# Patient Record
Sex: Male | Born: 1990 | Race: Black or African American | Hispanic: No | Marital: Single | Smoking: Never smoker
Health system: Southern US, Community
[De-identification: ages and names within clinical notes are randomized; demographics above are authoritative.]

## PROBLEM LIST (undated history)

## (undated) DIAGNOSIS — R7303 Prediabetes: Secondary | ICD-10-CM

## (undated) DIAGNOSIS — I2510 Atherosclerotic heart disease of native coronary artery without angina pectoris: Secondary | ICD-10-CM

## (undated) DIAGNOSIS — F1290 Cannabis use, unspecified, uncomplicated: Secondary | ICD-10-CM

## (undated) DIAGNOSIS — R931 Abnormal findings on diagnostic imaging of heart and coronary circulation: Secondary | ICD-10-CM

## (undated) DIAGNOSIS — W3400XA Accidental discharge from unspecified firearms or gun, initial encounter: Secondary | ICD-10-CM

## (undated) DIAGNOSIS — I255 Ischemic cardiomyopathy: Secondary | ICD-10-CM

## (undated) HISTORY — PX: ARTHROSCOPIC REPAIR ACL: SUR80

---
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
## 2011-07-26 LAB — RAPID URINE DRUG SCREEN, HOSP PERFORMED
Amphetamines: NOT DETECTED
Barbiturates: NOT DETECTED
Benzodiazepines: NOT DETECTED
Cocaine: NOT DETECTED
Opiates: NOT DETECTED
Tetrahydrocannabinol: POSITIVE — AB

## 2011-07-26 LAB — ETHANOL: Alcohol, Ethyl (B): <5

## 2011-07-26 LAB — SALICYLATE LEVEL: Salicylate Lvl: <4

## 2011-07-26 LAB — ACETAMINOPHEN LEVEL: Acetaminophen (Tylenol), Serum: <10 — ABNORMAL LOW

## 2012-11-04 ENCOUNTER — Emergency Department (HOSPITAL_COMMUNITY): Payer: Self-pay

## 2012-11-04 ENCOUNTER — Emergency Department (HOSPITAL_COMMUNITY)
Admission: EM | Admit: 2012-11-04 | Discharge: 2012-11-04 | Disposition: A | Discharge status: Home / Self Care | Payer: Self-pay | Attending: Emergency Medicine | Admitting: Emergency Medicine

## 2012-11-04 DIAGNOSIS — Y9301 Activity, walking, marching and hiking: Secondary | ICD-10-CM | POA: Insufficient documentation

## 2012-11-04 DIAGNOSIS — Z23 Encounter for immunization: Secondary | ICD-10-CM | POA: Insufficient documentation

## 2012-11-04 DIAGNOSIS — S61209A Unspecified open wound of unspecified finger without damage to nail, initial encounter: Secondary | ICD-10-CM | POA: Insufficient documentation

## 2012-11-04 DIAGNOSIS — S61239A Puncture wound without foreign body of unspecified finger without damage to nail, initial encounter: Secondary | ICD-10-CM

## 2012-11-04 DIAGNOSIS — W320XXA Accidental handgun discharge, initial encounter: Secondary | ICD-10-CM | POA: Insufficient documentation

## 2012-11-04 DIAGNOSIS — Y929 Unspecified place or not applicable: Secondary | ICD-10-CM | POA: Insufficient documentation

## 2012-11-04 MED ORDER — TETANUS-DIPHTH-ACELL PERTUSSIS 5-2.5-18.5 LF-MCG/0.5 IM SUSP
0.5000 mL | Freq: Once | INTRAMUSCULAR | Status: AC
  Administered 2012-11-04: 0.5 mL via INTRAMUSCULAR
  Filled 2012-11-04: qty 0.5

## 2012-11-04 MED ORDER — IBUPROFEN 600 MG PO TABS: 600.0000 mg | ORAL_TABLET | Freq: Four times a day (QID) | ORAL | Status: DC | PRN

## 2012-11-04 MED ORDER — AMOXICILLIN-POT CLAVULANATE 875-125 MG PO TABS: 1.0000 | ORAL_TABLET | Freq: Two times a day (BID) | ORAL | Status: DC

## 2012-11-04 MED ORDER — HYDROCODONE-ACETAMINOPHEN 5-325 MG PO TABS: 1.0000 | ORAL_TABLET | Freq: Four times a day (QID) | ORAL | Status: DC | PRN

## 2012-11-04 MED ORDER — AMOXICILLIN-POT CLAVULANATE 875-125 MG PO TABS
1.0000 | ORAL_TABLET | Freq: Once | ORAL | Status: AC
  Administered 2012-11-04: 1 via ORAL
  Filled 2012-11-04: qty 1
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
## 2012-11-19 ENCOUNTER — Emergency Department (HOSPITAL_COMMUNITY)
Admission: EM | Admit: 2012-11-19 | Discharge: 2012-11-19 | Disposition: A | Discharge status: Home / Self Care | Payer: Self-pay | Attending: Emergency Medicine | Admitting: Emergency Medicine

## 2012-11-19 ENCOUNTER — Encounter (HOSPITAL_COMMUNITY): Payer: Self-pay | Admitting: Cardiology

## 2012-11-19 ENCOUNTER — Emergency Department (HOSPITAL_COMMUNITY): Payer: Self-pay

## 2012-11-19 DIAGNOSIS — Z87828 Personal history of other (healed) physical injury and trauma: Secondary | ICD-10-CM | POA: Insufficient documentation

## 2012-11-19 DIAGNOSIS — Y929 Unspecified place or not applicable: Secondary | ICD-10-CM | POA: Insufficient documentation

## 2012-11-19 DIAGNOSIS — X58XXXA Exposure to other specified factors, initial encounter: Secondary | ICD-10-CM | POA: Insufficient documentation

## 2012-11-19 DIAGNOSIS — F17200 Nicotine dependence, unspecified, uncomplicated: Secondary | ICD-10-CM | POA: Insufficient documentation

## 2012-11-19 DIAGNOSIS — Y9302 Activity, running: Secondary | ICD-10-CM | POA: Insufficient documentation

## 2012-11-19 DIAGNOSIS — M25562 Pain in left knee: Secondary | ICD-10-CM

## 2012-11-19 DIAGNOSIS — S99919A Unspecified injury of unspecified ankle, initial encounter: Secondary | ICD-10-CM | POA: Insufficient documentation

## 2012-11-19 DIAGNOSIS — S8990XA Unspecified injury of unspecified lower leg, initial encounter: Secondary | ICD-10-CM | POA: Insufficient documentation

## 2012-11-19 DIAGNOSIS — Z4800 Encounter for change or removal of nonsurgical wound dressing: Secondary | ICD-10-CM | POA: Insufficient documentation

## 2012-11-19 MED ORDER — NAPROXEN 500 MG PO TABS: 500.0000 mg | ORAL_TABLET | Freq: Two times a day (BID) | ORAL | Status: DC

## 2012-11-19 MED ORDER — HYDROCODONE-ACETAMINOPHEN 5-325 MG PO TABS: 1.0000 | ORAL_TABLET | Freq: Four times a day (QID) | ORAL | Status: DC | PRN

## 2012-11-19 NOTE — ED Notes (Signed)
Discharge instructions reviewed. Pt verbalized understanding.  

## 2012-11-19 NOTE — Progress Notes (Signed)
Orthopedic Tech Progress Note Patient Details:  Paul Chandler November 19, 1990 [id number] Knee Immobilizer applied to Left LE with proper use and instructions given. Application tolerated well. Patient to wear immobilizer when walking.  Ortho Devices Type of Ortho Device: Knee Immobilizer Ortho Device/Splint Location: Left  Ortho Device/Splint Interventions: Application   Asia R Thompson 11/19/2012, 4:23 PM

## 2012-11-19 NOTE — ED Notes (Signed)
Onalee Hua NP at beside.

## 2012-11-19 NOTE — ED Notes (Signed)
Pt back from x-ray.

## 2012-11-19 NOTE — ED Provider Notes (Signed)
Medical screening examination/treatment/procedure(s) were performed by non-physician practitioner and as supervising physician I was immediately available for consultation/collaboration.   Preesha Benjamin J. Braxton Vantrease, MD 11/19/12 2300 

## 2012-11-19 NOTE — ED Notes (Signed)
Patient transported to X-ray 

## 2012-11-19 NOTE — ED Notes (Signed)
Pt states he injured his left knee yesterday playing around. Rates pain 7/10. Pt also states he has some staples in left hand that need to be removed.
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
## 2015-07-01 ENCOUNTER — Emergency Department (HOSPITAL_COMMUNITY): Payer: Self-pay

## 2015-07-01 ENCOUNTER — Encounter (HOSPITAL_COMMUNITY): Payer: Self-pay | Admitting: *Deleted

## 2015-07-01 ENCOUNTER — Encounter (HOSPITAL_COMMUNITY): Payer: Self-pay | Admitting: Cardiology

## 2015-07-01 ENCOUNTER — Emergency Department (HOSPITAL_COMMUNITY)
Admission: EM | Admit: 2015-07-01 | Discharge: 2015-07-01 | Disposition: A | Discharge status: Home / Self Care | Payer: Self-pay | Attending: Emergency Medicine | Admitting: Emergency Medicine

## 2015-07-01 DIAGNOSIS — W3400XA Accidental discharge from unspecified firearms or gun, initial encounter: Secondary | ICD-10-CM

## 2015-07-01 DIAGNOSIS — S21232A Puncture wound without foreign body of left back wall of thorax without penetration into thoracic cavity, initial encounter: Secondary | ICD-10-CM

## 2015-07-01 DIAGNOSIS — S21402A Unspecified open wound of left back wall of thorax with penetration into thoracic cavity, initial encounter: Secondary | ICD-10-CM | POA: Insufficient documentation

## 2015-07-01 DIAGNOSIS — Y939 Activity, unspecified: Secondary | ICD-10-CM | POA: Insufficient documentation

## 2015-07-01 DIAGNOSIS — Y999 Unspecified external cause status: Secondary | ICD-10-CM | POA: Insufficient documentation

## 2015-07-01 DIAGNOSIS — R000 Tachycardia, unspecified: Secondary | ICD-10-CM | POA: Insufficient documentation

## 2015-07-01 DIAGNOSIS — Y929 Unspecified place or not applicable: Secondary | ICD-10-CM | POA: Insufficient documentation

## 2015-07-01 LAB — PREPARE FRESH FROZEN PLASMA
Unit division: 0
Unit division: 0

## 2015-07-01 LAB — TYPE AND SCREEN
ABO/RH(D): A POS
ANTIBODY SCREEN: NEGATIVE
UNIT DIVISION: 0
Unit division: 0

## 2015-07-01 LAB — CDS SEROLOGY

## 2015-07-01 LAB — COMPREHENSIVE METABOLIC PANEL
ALK PHOS: 61 U/L (ref 38–126)
ALT: 21 U/L (ref 17–63)
ANION GAP: 14 (ref 5–15)
AST: 23 U/L (ref 15–41)
Albumin: 4.6 g/dL (ref 3.5–5.0)
BILIRUBIN TOTAL: 0.6 mg/dL (ref 0.3–1.2)
BUN: 14 mg/dL (ref 6–20)
CALCIUM: 9.3 mg/dL (ref 8.9–10.3)
CO2: 22 mmol/L (ref 22–32)
CREATININE: 1.52 mg/dL — AB (ref 0.61–1.24)
Chloride: 101 mmol/L (ref 101–111)
GFR calc non Af Amer: >60 mL/min (ref >60)
Glucose, Bld: 142 mg/dL — ABNORMAL HIGH (ref 65–99)
Potassium: 3.4 mmol/L — ABNORMAL LOW (ref 3.5–5.1)
SODIUM: 137 mmol/L (ref 135–145)
TOTAL PROTEIN: 7.4 g/dL (ref 6.5–8.1)

## 2015-07-01 LAB — ABO/RH: ABO/RH(D): A POS

## 2015-07-01 LAB — CBC
HCT: 46.4 % (ref 39.0–52.0)
Hemoglobin: 15.2 g/dL (ref 13.0–17.0)
MCH: 29.2 pg (ref 26.0–34.0)
MCHC: 32.8 g/dL (ref 30.0–36.0)
MCV: 89.1 fL (ref 78.0–100.0)
PLATELETS: 295 10*3/uL (ref 150–400)
RBC: 5.21 MIL/uL (ref 4.22–5.81)
RDW: 12.8 % (ref 11.5–15.5)
WBC: 6.8 10*3/uL (ref 4.0–10.5)

## 2015-07-01 LAB — ETHANOL

## 2015-07-01 LAB — PROTIME-INR
INR: 1.01 (ref 0.00–1.49)
PROTHROMBIN TIME: 13.5 s (ref 11.6–15.2)

## 2015-07-01 MED ORDER — MORPHINE SULFATE (PF) 4 MG/ML IV SOLN: 4.0000 mg | INTRAVENOUS | Status: DC | PRN

## 2015-07-01 MED ORDER — IOHEXOL 300 MG/ML  SOLN
75.0000 mL | Freq: Once | INTRAMUSCULAR | Status: AC | PRN
  Administered 2015-07-01: 75 mL via INTRAVENOUS

## 2015-07-01 MED ORDER — TRAMADOL HCL 50 MG PO TABS: 50.0000 mg | ORAL_TABLET | Freq: Four times a day (QID) | ORAL | Status: DC | PRN

## 2015-07-01 NOTE — ED Notes (Signed)
Detective at bedside
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
## 2015-07-01 NOTE — ED Notes (Signed)
Pt transported to CT on monitor with nurse at bedside.
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
## 2015-07-05 ENCOUNTER — Emergency Department (HOSPITAL_COMMUNITY): Payer: Self-pay

## 2015-07-05 ENCOUNTER — Encounter (HOSPITAL_COMMUNITY): Payer: Self-pay | Admitting: Emergency Medicine

## 2015-07-05 ENCOUNTER — Emergency Department (HOSPITAL_COMMUNITY)
Admission: EM | Admit: 2015-07-05 | Discharge: 2015-07-05 | Disposition: A | Discharge status: Home / Self Care | Payer: Self-pay | Attending: Emergency Medicine | Admitting: Emergency Medicine

## 2015-07-05 DIAGNOSIS — S21232D Puncture wound without foreign body of left back wall of thorax without penetration into thoracic cavity, subsequent encounter: Secondary | ICD-10-CM

## 2015-07-05 DIAGNOSIS — Z791 Long term (current) use of non-steroidal anti-inflammatories (NSAID): Secondary | ICD-10-CM | POA: Insufficient documentation

## 2015-07-05 DIAGNOSIS — S31000D Unspecified open wound of lower back and pelvis without penetration into retroperitoneum, subsequent encounter: Secondary | ICD-10-CM | POA: Insufficient documentation

## 2015-07-05 DIAGNOSIS — W3400XD Accidental discharge from unspecified firearms or gun, subsequent encounter: Secondary | ICD-10-CM | POA: Insufficient documentation

## 2015-07-05 HISTORY — DX: Accidental discharge from unspecified firearms or gun, initial encounter: W34.00XA

## 2015-07-05 MED ORDER — HYDROCODONE-ACETAMINOPHEN 5-325 MG PO TABS: 2.0000 | ORAL_TABLET | ORAL | Status: DC | PRN

## 2015-07-05 MED ORDER — SULFAMETHOXAZOLE-TRIMETHOPRIM 800-160 MG PO TABS: 1.0000 | ORAL_TABLET | Freq: Two times a day (BID) | ORAL | Status: AC
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
## 2015-07-16 ENCOUNTER — Telehealth (HOSPITAL_COMMUNITY): Payer: Self-pay | Admitting: Emergency Medicine
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
## 2015-10-11 ENCOUNTER — Encounter (HOSPITAL_COMMUNITY): Payer: Self-pay | Admitting: Family Medicine

## 2015-10-11 ENCOUNTER — Emergency Department (HOSPITAL_COMMUNITY)
Admission: EM | Admit: 2015-10-11 | Discharge: 2015-10-11 | Disposition: A | Discharge status: Home / Self Care | Payer: Self-pay | Attending: Emergency Medicine | Admitting: Emergency Medicine

## 2015-10-11 ENCOUNTER — Emergency Department (HOSPITAL_COMMUNITY): Payer: Self-pay

## 2015-10-11 DIAGNOSIS — F17200 Nicotine dependence, unspecified, uncomplicated: Secondary | ICD-10-CM | POA: Insufficient documentation

## 2015-10-11 DIAGNOSIS — N23 Unspecified renal colic: Secondary | ICD-10-CM | POA: Insufficient documentation

## 2015-10-11 DIAGNOSIS — N200 Calculus of kidney: Secondary | ICD-10-CM | POA: Insufficient documentation

## 2015-10-11 LAB — URINALYSIS, ROUTINE W REFLEX MICROSCOPIC
BILIRUBIN URINE: NEGATIVE
Glucose, UA: 100 mg/dL — AB
Ketones, ur: 15 mg/dL — AB
NITRITE: NEGATIVE
PROTEIN: 100 mg/dL — AB
SPECIFIC GRAVITY, URINE: 1.026 (ref 1.005–1.030)
pH: 8 (ref 5.0–8.0)

## 2015-10-11 LAB — COMPREHENSIVE METABOLIC PANEL
ALBUMIN: 4.6 g/dL (ref 3.5–5.0)
ALK PHOS: 54 U/L (ref 38–126)
ALT: 23 U/L (ref 17–63)
ANION GAP: 9 (ref 5–15)
AST: 23 U/L (ref 15–41)
BILIRUBIN TOTAL: 0.3 mg/dL (ref 0.3–1.2)
BUN: 12 mg/dL (ref 6–20)
CALCIUM: 9.8 mg/dL (ref 8.9–10.3)
CO2: 25 mmol/L (ref 22–32)
Chloride: 105 mmol/L (ref 101–111)
Creatinine, Ser: 1.21 mg/dL (ref 0.61–1.24)
Glucose, Bld: 134 mg/dL — ABNORMAL HIGH (ref 65–99)
Potassium: 4.2 mmol/L (ref 3.5–5.1)
Sodium: 139 mmol/L (ref 135–145)
TOTAL PROTEIN: 7.3 g/dL (ref 6.5–8.1)

## 2015-10-11 LAB — CBC
HCT: 45.8 % (ref 39.0–52.0)
HEMOGLOBIN: 15 g/dL (ref 13.0–17.0)
MCH: 28.6 pg (ref 26.0–34.0)
MCHC: 32.8 g/dL (ref 30.0–36.0)
MCV: 87.2 fL (ref 78.0–100.0)
PLATELETS: 288 10*3/uL (ref 150–400)
RBC: 5.25 MIL/uL (ref 4.22–5.81)
RDW: 12.8 % (ref 11.5–15.5)
WBC: 9.1 10*3/uL (ref 4.0–10.5)

## 2015-10-11 LAB — URINE MICROSCOPIC-ADD ON: SQUAMOUS EPITHELIAL / LPF: NONE SEEN

## 2015-10-11 LAB — LIPASE, BLOOD: Lipase: 19 U/L (ref 11–51)

## 2015-10-11 MED ORDER — LORAZEPAM 2 MG/ML IJ SOLN
1.0000 mg | Freq: Once | INTRAMUSCULAR | Status: AC
  Administered 2015-10-11: 1 mg via INTRAVENOUS
  Filled 2015-10-11: qty 1

## 2015-10-11 MED ORDER — KETOROLAC TROMETHAMINE 30 MG/ML IJ SOLN
30.0000 mg | Freq: Once | INTRAMUSCULAR | Status: AC
  Administered 2015-10-11: 30 mg via INTRAVENOUS
  Filled 2015-10-11: qty 1

## 2015-10-11 MED ORDER — OXYCODONE-ACETAMINOPHEN 5-325 MG PO TABS: 1.0000 | ORAL_TABLET | Freq: Four times a day (QID) | ORAL | Status: DC | PRN

## 2015-10-11 MED ORDER — HYDROMORPHONE HCL 1 MG/ML IJ SOLN
1.0000 mg | Freq: Once | INTRAMUSCULAR | Status: AC
  Administered 2015-10-11: 1 mg via INTRAVENOUS
  Filled 2015-10-11: qty 1

## 2015-10-11 MED ORDER — ONDANSETRON HCL 4 MG/2ML IJ SOLN
4.0000 mg | Freq: Once | INTRAMUSCULAR | Status: AC
  Administered 2015-10-11: 4 mg via INTRAVENOUS
  Filled 2015-10-11: qty 2

## 2015-10-11 NOTE — ED Notes (Signed)
Pt here for lower back pain and abd pain that started all of a sudden today. sts burning, N,V

## 2015-10-11 NOTE — ED Notes (Signed)
Pt is aware urine is needed, pt unable to urinate at this time. Urinal at bedside.  
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
## 2015-10-11 NOTE — Discharge Instructions (Signed)
It was our pleasure to provide your ER care today - we hope that you feel better.  Drink plenty of fluids. Strain urine.   Take motrin or aleve as need for pain. You may also take percocet as need for pain - no driving for the next 6 hours or when taking percocet. Also, do not take tylenol or acetaminophen containing medications when taking percocet.   Follow up with urologist in next few days if symptoms fail to resolve - see referral, call office to arrange appointment.   Return to ER if worse, intractable pain, fevers, persistent vomiting, other concern.   You were given pain medication in the ER - no driving for the next 6 hours.    Kidney Stones Kidney stones (urolithiasis) are deposits that form inside your kidneys. The intense pain is caused by the stone moving through the urinary tract. When the stone moves, the ureter goes into spasm around the stone. The stone is usually passed in the urine.  CAUSES   A disorder that makes certain neck glands produce too much parathyroid hormone (primary hyperparathyroidism).  A buildup of uric acid crystals, similar to gout in your joints.  Narrowing (stricture) of the ureter.  A kidney obstruction present at birth (congenital obstruction).  Previous surgery on the kidney or ureters.  Numerous kidney infections. SYMPTOMS   Feeling sick to your stomach (nauseous).  Throwing up (vomiting).  Blood in the urine (hematuria).  Pain that usually spreads (radiates) to the groin.  Frequency or urgency of urination. DIAGNOSIS   Taking a history and physical exam.  Blood or urine tests.  CT scan.  Occasionally, an examination of the inside of the urinary bladder (cystoscopy) is performed. TREATMENT   Observation.  Increasing your fluid intake.  Extracorporeal shock wave lithotripsy--This is a noninvasive procedure that uses shock waves to break up kidney stones.  Surgery may be needed if you have severe pain or persistent  obstruction. There are various surgical procedures. Most of the procedures are performed with the use of small instruments. Only small incisions are needed to accommodate these instruments, so recovery time is minimized. The size, location, and chemical composition are all important variables that will determine the proper choice of action for you. Talk to your health care provider to better understand your situation so that you will minimize the risk of injury to yourself and your kidney.  HOME CARE INSTRUCTIONS   Drink enough water and fluids to keep your urine clear or pale yellow. This will help you to pass the stone or stone fragments.  Strain all urine through the provided strainer. Keep all particulate matter and stones for your health care provider to see. The stone causing the pain may be as small as a grain of salt. It is very important to use the strainer each and every time you pass your urine. The collection of your stone will allow your health care provider to analyze it and verify that a stone has actually passed. The stone analysis will often identify what you can do to reduce the incidence of recurrences.  Only take over-the-counter or prescription medicines for pain, discomfort, or fever as directed by your health care provider.  Keep all follow-up visits as told by your health care provider. This is important.  Get follow-up X-rays if required. The absence of pain does not always mean that the stone has passed. It may have only stopped moving. If the urine remains completely obstructed, it can cause loss of kidney  function or even complete destruction of the kidney. It is your responsibility to make sure X-rays and follow-ups are completed. Ultrasounds of the kidney can show blockages and the status of the kidney. Ultrasounds are not associated with any radiation and can be performed easily in a matter of minutes.  Make changes to your daily diet as told by your health care provider.  You may be told to:  Limit the amount of salt that you eat.  Eat 5 or more servings of fruits and vegetables each day.  Limit the amount of meat, poultry, fish, and eggs that you eat.  Collect a 24-hour urine sample as told by your health care provider.You may need to collect another urine sample every 6-12 months. SEEK MEDICAL CARE IF:  You experience pain that is progressive and unresponsive to any pain medicine you have been prescribed. SEEK IMMEDIATE MEDICAL CARE IF:   Pain cannot be controlled with the prescribed medicine.  You have a fever or shaking chills.  The severity or intensity of pain increases over 18 hours and is not relieved by pain medicine.  You develop a new onset of abdominal pain.  You feel faint or pass out.  You are unable to urinate.   This information is not intended to replace advice given to you by your health care provider. Make sure you discuss any questions you have with your health care provider.   Document Released: 10/09/2005 Document Revised: 06/30/2015 Document Reviewed: 03/12/2013 Elsevier Interactive Patient Education [employer]2016 [employer]

## 2015-10-13 ENCOUNTER — Encounter (HOSPITAL_COMMUNITY): Payer: Self-pay | Admitting: *Deleted

## 2015-10-13 ENCOUNTER — Emergency Department (HOSPITAL_COMMUNITY)
Admission: EM | Admit: 2015-10-13 | Discharge: 2015-10-13 | Disposition: A | Discharge status: Home / Self Care | Payer: Self-pay | Attending: Emergency Medicine | Admitting: Emergency Medicine

## 2015-10-13 DIAGNOSIS — Z87828 Personal history of other (healed) physical injury and trauma: Secondary | ICD-10-CM | POA: Insufficient documentation

## 2015-10-13 DIAGNOSIS — N200 Calculus of kidney: Secondary | ICD-10-CM | POA: Insufficient documentation

## 2015-10-13 DIAGNOSIS — R55 Syncope and collapse: Secondary | ICD-10-CM | POA: Insufficient documentation

## 2015-10-13 DIAGNOSIS — R42 Dizziness and giddiness: Secondary | ICD-10-CM | POA: Insufficient documentation

## 2015-10-13 DIAGNOSIS — R630 Anorexia: Secondary | ICD-10-CM | POA: Insufficient documentation

## 2015-10-13 DIAGNOSIS — F17200 Nicotine dependence, unspecified, uncomplicated: Secondary | ICD-10-CM | POA: Insufficient documentation

## 2015-10-13 LAB — CBC
HEMATOCRIT: 46.4 % (ref 39.0–52.0)
HEMOGLOBIN: 15.2 g/dL (ref 13.0–17.0)
MCH: 28.7 pg (ref 26.0–34.0)
MCHC: 32.8 g/dL (ref 30.0–36.0)
MCV: 87.5 fL (ref 78.0–100.0)
Platelets: 289 10*3/uL (ref 150–400)
RBC: 5.3 MIL/uL (ref 4.22–5.81)
RDW: 13 % (ref 11.5–15.5)
WBC: 9.2 10*3/uL (ref 4.0–10.5)

## 2015-10-13 LAB — COMPREHENSIVE METABOLIC PANEL
ALBUMIN: 4.5 g/dL (ref 3.5–5.0)
ALT: 33 U/L (ref 17–63)
ANION GAP: 10 (ref 5–15)
AST: 32 U/L (ref 15–41)
Alkaline Phosphatase: 60 U/L (ref 38–126)
BILIRUBIN TOTAL: 0.9 mg/dL (ref 0.3–1.2)
BUN: 12 mg/dL (ref 6–20)
CHLORIDE: 104 mmol/L (ref 101–111)
CO2: 26 mmol/L (ref 22–32)
Calcium: 10.1 mg/dL (ref 8.9–10.3)
Creatinine, Ser: 1.66 mg/dL — ABNORMAL HIGH (ref 0.61–1.24)
GFR, EST NON AFRICAN AMERICAN: 57 mL/min — AB (ref >60)
Glucose, Bld: 109 mg/dL — ABNORMAL HIGH (ref 65–99)
POTASSIUM: 4.2 mmol/L (ref 3.5–5.1)
SODIUM: 140 mmol/L (ref 135–145)
TOTAL PROTEIN: 7.6 g/dL (ref 6.5–8.1)

## 2015-10-13 LAB — URINE MICROSCOPIC-ADD ON

## 2015-10-13 LAB — URINALYSIS, ROUTINE W REFLEX MICROSCOPIC
Bilirubin Urine: NEGATIVE
GLUCOSE, UA: NEGATIVE mg/dL
KETONES UR: NEGATIVE mg/dL
LEUKOCYTES UA: NEGATIVE
NITRITE: NEGATIVE
PH: 8 (ref 5.0–8.0)
Protein, ur: 30 mg/dL — AB
Specific Gravity, Urine: 1.025 (ref 1.005–1.030)

## 2015-10-13 LAB — LIPASE, BLOOD: LIPASE: 20 U/L (ref 11–51)

## 2015-10-13 MED ORDER — ONDANSETRON 4 MG PO TBDP
ORAL_TABLET | ORAL | Status: AC
  Filled 2015-10-13: qty 1

## 2015-10-13 MED ORDER — SODIUM CHLORIDE 0.9 % IV SOLN
Freq: Once | INTRAVENOUS | Status: AC
  Administered 2015-10-13: 16:00:00 via INTRAVENOUS

## 2015-10-13 MED ORDER — KETOROLAC TROMETHAMINE 30 MG/ML IJ SOLN
30.0000 mg | Freq: Once | INTRAMUSCULAR | Status: AC
  Administered 2015-10-13: 30 mg via INTRAVENOUS
  Filled 2015-10-13: qty 1

## 2015-10-13 MED ORDER — ONDANSETRON 4 MG PO TBDP
4.0000 mg | ORAL_TABLET | Freq: Once | ORAL | Status: AC | PRN
  Administered 2015-10-13: 4 mg via ORAL

## 2015-10-13 MED ORDER — ONDANSETRON HCL 4 MG/2ML IJ SOLN
4.0000 mg | Freq: Once | INTRAMUSCULAR | Status: AC
  Administered 2015-10-13: 4 mg via INTRAVENOUS
  Filled 2015-10-13: qty 2

## 2015-10-13 MED ORDER — ONDANSETRON HCL 4 MG PO TABS: 4.0000 mg | ORAL_TABLET | Freq: Three times a day (TID) | ORAL | Status: DC | PRN
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
## 2015-10-13 NOTE — ED Notes (Signed)
Pt reports being seen here on Monday and diagnosed with kidney stone, now having n/v and reports vomiting 15 times today. Reports feeling weak and near syncope. No acute distress noted at triage.

## 2015-10-13 NOTE — ED Notes (Signed)
Pt is in stable condition upon d/c and ambulates from ED. 

## 2015-10-13 NOTE — Discharge Instructions (Signed)
[name], you were seen in the emergency room for a kidney stone. For your pain, you can finish out your hydrocodone and you can also take 4 tablets of 200 mg ibuprofen three times a day with meals. We are also prescribing zofran tablets that you can take at home.    Dietary Guidelines to Help Prevent Kidney Stones Your risk of kidney stones can be decreased by adjusting the foods you eat. The most important thing you can do is drink enough fluid. You should drink enough fluid to keep your urine clear or pale yellow. The following guidelines provide specific information for the type of kidney stone you have had. GUIDELINES ACCORDING TO TYPE OF KIDNEY STONE Calcium Oxalate Kidney Stones  Reduce the amount of salt you eat. Foods that have a lot of salt cause your body to release excess calcium into your urine. The excess calcium can combine with a substance called oxalate to form kidney stones.  Reduce the amount of animal protein you eat if the amount you eat is excessive. Animal protein causes your body to release excess calcium into your urine. Ask your dietitian how much protein from animal sources you should be eating.  Avoid foods that are high in oxalates. If you take vitamins, they should have less than 500 mg of vitamin C. Your body turns vitamin C into oxalates. You do not need to avoid fruits and vegetables high in vitamin C. Calcium Phosphate Kidney Stones  Reduce the amount of salt you eat to help prevent the release of excess calcium into your urine.  Reduce the amount of animal protein you eat if the amount you eat is excessive. Animal protein causes your body to release excess calcium into your urine. Ask your dietitian how much protein from animal sources you should be eating.  Get enough calcium from food or take a calcium supplement (ask your dietitian for recommendations). Food sources of calcium that do not increase your risk of kidney stones include:  Broccoli.  Dairy  products, such as cheese and yogurt.  Pudding. Uric Acid Kidney Stones  Do not have more than 6 oz of animal protein per day. FOOD SOURCES Animal Protein Sources  Meat (all types).  Poultry.  Eggs.  Fish, seafood. Foods High in MirantSalt  Salt seasonings.  Soy sauce.  Teriyaki sauce.  Cured and processed meats.  Salted crackers and snack foods.  Fast food.  Canned soups and most canned foods. Foods High in Oxalates  Grains:  Amaranth.  Barley.  Grits.  Wheat germ.  Bran.  Buckwheat flour.  All bran cereals.  Pretzels.  Whole wheat bread.  Vegetables:  Beans (wax).  Beets and beet greens.  Collard greens.  Eggplant.  Escarole.  Leeks.  Okra.  Parsley.  Rutabagas.  Spinach.  Swiss chard.  Tomato paste.  Fried potatoes.  Sweet potatoes.  Fruits:  Red currants.  Figs.  Kiwi.  Rhubarb.  Meat and Other Protein Sources:  Beans (dried).  Soy burgers and other soybean products.  Miso.  Nuts (peanuts, almonds, pecans, cashews, hazelnuts).  Nut butters.  Sesame seeds and tahini (paste made of sesame seeds).  Poppy seeds.  Beverages:  Chocolate drink mixes.  Soy milk.  Instant iced tea.  Juices made from high-oxalate fruits or vegetables.  Other:  Carob.  Chocolate.  Fruitcake.  Marmalades.   This information is not intended to replace advice given to you by your health care provider. Make sure you discuss any questions you have with  your health care provider.   Document Released: 02/03/2011 Document Revised: 10/14/2013 Document Reviewed: 09/05/2013 Elsevier Interactive Patient Education [employer]
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
## 2017-04-12 ENCOUNTER — Encounter (HOSPITAL_COMMUNITY): Payer: Self-pay

## 2017-04-12 ENCOUNTER — Emergency Department (HOSPITAL_COMMUNITY)
Admission: EM | Admit: 2017-04-12 | Discharge: 2017-04-12 | Disposition: A | Discharge status: Home / Self Care | Payer: Self-pay | Attending: Emergency Medicine | Admitting: Emergency Medicine

## 2017-04-12 DIAGNOSIS — Z79899 Other long term (current) drug therapy: Secondary | ICD-10-CM | POA: Insufficient documentation

## 2017-04-12 DIAGNOSIS — L0291 Cutaneous abscess, unspecified: Secondary | ICD-10-CM | POA: Insufficient documentation

## 2017-04-12 DIAGNOSIS — F17210 Nicotine dependence, cigarettes, uncomplicated: Secondary | ICD-10-CM | POA: Insufficient documentation

## 2017-04-12 DIAGNOSIS — A64 Unspecified sexually transmitted disease: Secondary | ICD-10-CM | POA: Insufficient documentation

## 2017-04-12 DIAGNOSIS — M795 Residual foreign body in soft tissue: Secondary | ICD-10-CM | POA: Insufficient documentation

## 2017-04-12 MED ORDER — AZITHROMYCIN 250 MG PO TABS
1000.0000 mg | ORAL_TABLET | Freq: Once | ORAL | Status: AC
  Administered 2017-04-12: 1000 mg via ORAL
  Filled 2017-04-12: qty 4

## 2017-04-12 MED ORDER — LIDOCAINE HCL (PF) 1 % IJ SOLN
0.9000 mL | Freq: Once | INTRAMUSCULAR | Status: AC
  Administered 2017-04-12: 0.9 mL
  Filled 2017-04-12: qty 5

## 2017-04-12 MED ORDER — LIDOCAINE HCL 2 % IJ SOLN
20.0000 mL | Freq: Once | INTRAMUSCULAR | Status: AC
  Administered 2017-04-12: 400 mg
  Filled 2017-04-12: qty 20

## 2017-04-12 MED ORDER — CEFTRIAXONE SODIUM 250 MG IJ SOLR
250.0000 mg | Freq: Once | INTRAMUSCULAR | Status: AC
  Administered 2017-04-12: 250 mg via INTRAMUSCULAR
  Filled 2017-04-12: qty 250

## 2017-04-12 MED ORDER — ONDANSETRON 4 MG PO TBDP
4.0000 mg | ORAL_TABLET | Freq: Once | ORAL | Status: AC
  Administered 2017-04-12: 4 mg via ORAL
  Filled 2017-04-12: qty 1

## 2017-04-12 MED ORDER — TRAMADOL HCL 50 MG PO TABS: 50.0000 mg | ORAL_TABLET | Freq: Four times a day (QID) | ORAL | 0 refills | Status: DC | PRN

## 2017-04-12 MED ORDER — SULFAMETHOXAZOLE-TRIMETHOPRIM 800-160 MG PO TABS: 1.0000 | ORAL_TABLET | Freq: Two times a day (BID) | ORAL | 0 refills | Status: DC

## 2017-04-12 MED ORDER — TRAMADOL HCL 50 MG PO TABS
50.0000 mg | ORAL_TABLET | Freq: Once | ORAL | Status: AC
  Administered 2017-04-12: 50 mg via ORAL
  Filled 2017-04-12: qty 1
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
## 2017-04-12 NOTE — ED Notes (Signed)
EDP at bedside  

## 2017-04-13 LAB — GC/CHLAMYDIA PROBE AMP (~~LOC~~) NOT AT ARMC
Chlamydia: NEGATIVE
Neisseria Gonorrhea: NEGATIVE

## 2017-04-14 ENCOUNTER — Encounter (HOSPITAL_COMMUNITY): Payer: Self-pay | Admitting: Emergency Medicine

## 2017-04-14 ENCOUNTER — Emergency Department (HOSPITAL_COMMUNITY)
Admission: EM | Admit: 2017-04-14 | Discharge: 2017-04-14 | Disposition: A | Discharge status: Home / Self Care | Payer: Self-pay | Attending: Emergency Medicine | Admitting: Emergency Medicine

## 2017-04-14 DIAGNOSIS — Z5321 Procedure and treatment not carried out due to patient leaving prior to being seen by health care provider: Secondary | ICD-10-CM | POA: Insufficient documentation

## 2017-04-14 DIAGNOSIS — Z4800 Encounter for change or removal of nonsurgical wound dressing: Secondary | ICD-10-CM | POA: Insufficient documentation

## 2017-04-14 NOTE — ED Notes (Signed)
Pt called for room assignment x3. No answer. 

## 2017-04-14 NOTE — ED Notes (Signed)
Pt called for room x 4 - no answer. Do not see patient in lobby or outside entrance doors.
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
## 2017-04-17 ENCOUNTER — Emergency Department (HOSPITAL_COMMUNITY)
Admission: EM | Admit: 2017-04-17 | Discharge: 2017-04-17 | Disposition: A | Discharge status: Home / Self Care | Payer: Self-pay | Attending: Emergency Medicine | Admitting: Emergency Medicine

## 2017-04-17 ENCOUNTER — Encounter (HOSPITAL_COMMUNITY): Payer: Self-pay | Admitting: Vascular Surgery

## 2017-04-17 DIAGNOSIS — Z4801 Encounter for change or removal of surgical wound dressing: Secondary | ICD-10-CM | POA: Insufficient documentation

## 2017-04-17 DIAGNOSIS — Z5189 Encounter for other specified aftercare: Secondary | ICD-10-CM

## 2017-04-17 MED ORDER — SULFAMETHOXAZOLE-TRIMETHOPRIM 800-160 MG PO TABS: 1.0000 | ORAL_TABLET | Freq: Two times a day (BID) | ORAL | 0 refills | Status: AC
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
## 2017-04-17 NOTE — Discharge Instructions (Signed)
Keep wound clean with soap and water Keep covered until it heals completely  Take antibiotic for the next week Return for worsening symptoms

## 2018-01-30 ENCOUNTER — Emergency Department (HOSPITAL_COMMUNITY)
Admission: EM | Admit: 2018-01-30 | Discharge: 2018-01-30 | Disposition: A | Discharge status: Home / Self Care | Payer: Self-pay | Attending: Emergency Medicine | Admitting: Emergency Medicine

## 2018-01-30 DIAGNOSIS — F17210 Nicotine dependence, cigarettes, uncomplicated: Secondary | ICD-10-CM | POA: Insufficient documentation

## 2018-01-30 DIAGNOSIS — R369 Urethral discharge, unspecified: Secondary | ICD-10-CM | POA: Insufficient documentation

## 2018-01-30 MED ORDER — STERILE WATER FOR INJECTION IJ SOLN
INTRAMUSCULAR | Status: AC
  Filled 2018-01-30: qty 10

## 2018-01-30 MED ORDER — AZITHROMYCIN 250 MG PO TABS
1000.0000 mg | ORAL_TABLET | Freq: Once | ORAL | Status: AC
  Administered 2018-01-30: 1000 mg via ORAL
  Filled 2018-01-30: qty 4

## 2018-01-30 MED ORDER — CEFTRIAXONE SODIUM 250 MG IJ SOLR
250.0000 mg | Freq: Once | INTRAMUSCULAR | Status: AC
  Administered 2018-01-30: 250 mg via INTRAMUSCULAR
  Filled 2018-01-30: qty 250

## 2018-01-30 MED ORDER — METRONIDAZOLE 500 MG PO TABS
2000.0000 mg | ORAL_TABLET | Freq: Once | ORAL | Status: AC
  Administered 2018-01-30: 2000 mg via ORAL
  Filled 2018-01-30: qty 4

## 2018-01-30 NOTE — ED Triage Notes (Signed)
Pt to ER for 2 days of clear penile discharge. Denies urinary symptoms. States possible exposure to STD.

## 2018-01-30 NOTE — Discharge Instructions (Addendum)
No intercourse for a week.  If your cultures come back abnormal we will call you.  You were treated today for possible gonorrhea, chlamydia, trichomonas infections.  Follow-up with health department as needed
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
## 2018-01-31 LAB — GC/CHLAMYDIA PROBE AMP (~~LOC~~) NOT AT ARMC
Chlamydia: NEGATIVE
Neisseria Gonorrhea: NEGATIVE

## 2018-04-04 ENCOUNTER — Other Ambulatory Visit: Payer: Self-pay

## 2018-04-04 ENCOUNTER — Emergency Department (HOSPITAL_COMMUNITY)
Admission: EM | Admit: 2018-04-04 | Discharge: 2018-04-04 | Disposition: A | Discharge status: Home / Self Care | Payer: Self-pay | Attending: Emergency Medicine | Admitting: Emergency Medicine

## 2018-04-04 ENCOUNTER — Encounter (HOSPITAL_COMMUNITY): Payer: Self-pay | Admitting: Emergency Medicine

## 2018-04-04 DIAGNOSIS — M7918 Myalgia, other site: Secondary | ICD-10-CM

## 2018-04-04 DIAGNOSIS — R369 Urethral discharge, unspecified: Secondary | ICD-10-CM

## 2018-04-04 DIAGNOSIS — F17200 Nicotine dependence, unspecified, uncomplicated: Secondary | ICD-10-CM | POA: Insufficient documentation

## 2018-04-04 LAB — I-STAT CHEM 8, ED
BUN: 14 mg/dL (ref 6–20)
CREATININE: 1.1 mg/dL (ref 0.61–1.24)
Calcium, Ion: 1.26 mmol/L (ref 1.15–1.40)
Chloride: 101 mmol/L (ref 101–111)
GLUCOSE: 75 mg/dL (ref 65–99)
HEMATOCRIT: 51 % (ref 39.0–52.0)
HEMOGLOBIN: 17.3 g/dL — AB (ref 13.0–17.0)
Potassium: 4 mmol/L (ref 3.5–5.1)
Sodium: 141 mmol/L (ref 135–145)
TCO2: 28 mmol/L (ref 22–32)

## 2018-04-04 LAB — RAPID HIV SCREEN (HIV 1/2 AB+AG)
HIV 1/2 ANTIBODIES: NONREACTIVE
HIV-1 P24 ANTIGEN - HIV24: NONREACTIVE

## 2018-04-04 LAB — URINALYSIS, ROUTINE W REFLEX MICROSCOPIC
BACTERIA UA: NONE SEEN
Bilirubin Urine: NEGATIVE
GLUCOSE, UA: NEGATIVE mg/dL
KETONES UR: NEGATIVE mg/dL
Leukocytes, UA: NEGATIVE
Nitrite: NEGATIVE
PROTEIN: NEGATIVE mg/dL
Specific Gravity, Urine: 1.024 (ref 1.005–1.030)
pH: 6 (ref 5.0–8.0)

## 2018-04-04 MED ORDER — LIDOCAINE HCL (PF) 1 % IJ SOLN
INTRAMUSCULAR | Status: AC
  Administered 2018-04-04: 2 mL
  Filled 2018-04-04: qty 5

## 2018-04-04 MED ORDER — CEFTRIAXONE SODIUM 250 MG IJ SOLR
250.0000 mg | Freq: Once | INTRAMUSCULAR | Status: AC
  Administered 2018-04-04: 250 mg via INTRAMUSCULAR
  Filled 2018-04-04: qty 250

## 2018-04-04 MED ORDER — AZITHROMYCIN 250 MG PO TABS
1000.0000 mg | ORAL_TABLET | Freq: Once | ORAL | Status: AC
  Administered 2018-04-04: 1000 mg via ORAL
  Filled 2018-04-04: qty 4

## 2018-04-04 NOTE — ED Triage Notes (Addendum)
Pt reports he is worried about STDs, states he thinks he may have been exposed to something, c/o white to clear penile discharge as well as some left sided abdominal/flank pain. States pain comes and goes and feels similar to a previous kidney stone.

## 2018-04-04 NOTE — Discharge Instructions (Signed)
You have been tested for HIV, syphilis, chlamydia and gonorrhea.  These results will be available in approximately 3 days and you will be contacted by the hospital if the results are positive. Avoid sexual contact until you are aware of the results, and please inform all sexual partners if you test positive for any of these diseases. ° °Please follow up with your primary care provider within 5-7 days for re-evaluation of your symptoms. If you do not have a primary care provider, information for a healthcare clinic has been provided for you to make arrangements for follow up care. Please return to the emergency department for any new or worsening symptoms. ° ° °

## 2018-04-04 NOTE — ED Notes (Signed)
Pt verbalized understanding discharge instructions and denies any further needs or questions at this time. VS stable, ambulatory and steady gait.   
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
## 2018-04-05 LAB — URINE CULTURE: CULTURE: NO GROWTH

## 2018-04-05 LAB — RPR: RPR: NONREACTIVE

## 2018-04-05 LAB — GC/CHLAMYDIA PROBE AMP (~~LOC~~) NOT AT ARMC
CHLAMYDIA, DNA PROBE: NEGATIVE
NEISSERIA GONORRHEA: NEGATIVE

## 2019-03-04 ENCOUNTER — Emergency Department (HOSPITAL_COMMUNITY): Payer: Self-pay

## 2019-03-04 ENCOUNTER — Other Ambulatory Visit: Payer: Self-pay

## 2019-03-04 ENCOUNTER — Emergency Department (HOSPITAL_COMMUNITY)
Admission: EM | Admit: 2019-03-04 | Discharge: 2019-03-04 | Disposition: A | Discharge status: Home / Self Care | Payer: Self-pay | Attending: Emergency Medicine | Admitting: Emergency Medicine

## 2019-03-04 ENCOUNTER — Encounter (HOSPITAL_COMMUNITY): Payer: Self-pay

## 2019-03-04 DIAGNOSIS — R1030 Lower abdominal pain, unspecified: Secondary | ICD-10-CM

## 2019-03-04 DIAGNOSIS — N23 Unspecified renal colic: Secondary | ICD-10-CM | POA: Insufficient documentation

## 2019-03-04 DIAGNOSIS — R109 Unspecified abdominal pain: Secondary | ICD-10-CM | POA: Insufficient documentation

## 2019-03-04 DIAGNOSIS — Z87891 Personal history of nicotine dependence: Secondary | ICD-10-CM | POA: Insufficient documentation

## 2019-03-04 LAB — BASIC METABOLIC PANEL
Anion gap: 10 (ref 5–15)
BUN: 21 mg/dL — ABNORMAL HIGH (ref 6–20)
CO2: 24 mmol/L (ref 22–32)
Calcium: 10.4 mg/dL — ABNORMAL HIGH (ref 8.9–10.3)
Chloride: 104 mmol/L (ref 98–111)
Creatinine, Ser: 1.35 mg/dL — ABNORMAL HIGH (ref 0.61–1.24)
GFR calc Af Amer: >60 mL/min (ref >60)
GFR calc non Af Amer: >60 mL/min (ref >60)
Glucose, Bld: 144 mg/dL — ABNORMAL HIGH (ref 70–99)
Potassium: 3.7 mmol/L (ref 3.5–5.1)
Sodium: 138 mmol/L (ref 135–145)

## 2019-03-04 LAB — CBC WITH DIFFERENTIAL/PLATELET
Abs Immature Granulocytes: 0.04 10*3/uL (ref 0.00–0.07)
Basophils Absolute: 0 10*3/uL (ref 0.0–0.1)
Basophils Relative: 0 %
Eosinophils Absolute: 0 10*3/uL (ref 0.0–0.5)
Eosinophils Relative: 0 %
HCT: 47.3 % (ref 39.0–52.0)
Hemoglobin: 15.2 g/dL (ref 13.0–17.0)
Immature Granulocytes: 0 %
Lymphocytes Relative: 20 %
Lymphs Abs: 1.9 10*3/uL (ref 0.7–4.0)
MCH: 28.7 pg (ref 26.0–34.0)
MCHC: 32.1 g/dL (ref 30.0–36.0)
MCV: 89.4 fL (ref 80.0–100.0)
Monocytes Absolute: 0.8 10*3/uL (ref 0.1–1.0)
Monocytes Relative: 8 %
Neutro Abs: 6.9 10*3/uL (ref 1.7–7.7)
Neutrophils Relative %: 72 %
Platelets: 293 10*3/uL (ref 150–400)
RBC: 5.29 MIL/uL (ref 4.22–5.81)
RDW: 13.1 % (ref 11.5–15.5)
WBC: 9.7 10*3/uL (ref 4.0–10.5)
nRBC: 0 % (ref 0.0–0.2)

## 2019-03-04 LAB — URINALYSIS, ROUTINE W REFLEX MICROSCOPIC
Bilirubin Urine: NEGATIVE
Glucose, UA: NEGATIVE mg/dL
Ketones, ur: NEGATIVE mg/dL
Leukocytes,Ua: NEGATIVE
Nitrite: NEGATIVE
Protein, ur: 100 mg/dL — AB
RBC / HPF: >50 RBC/hpf — ABNORMAL HIGH (ref 0–5)
Specific Gravity, Urine: 1.017 (ref 1.005–1.030)
pH: 6 (ref 5.0–8.0)

## 2019-03-04 MED ORDER — AZITHROMYCIN 250 MG PO TABS
1000.0000 mg | ORAL_TABLET | Freq: Once | ORAL | Status: AC
  Administered 2019-03-04: 12:00:00 1000 mg via ORAL
  Filled 2019-03-04: qty 4

## 2019-03-04 MED ORDER — CEFTRIAXONE SODIUM 250 MG IJ SOLR
250.0000 mg | Freq: Once | INTRAMUSCULAR | Status: AC
  Administered 2019-03-04: 12:00:00 250 mg via INTRAMUSCULAR
  Filled 2019-03-04: qty 250

## 2019-03-04 MED ORDER — STERILE WATER FOR INJECTION IJ SOLN
INTRAMUSCULAR | Status: AC
  Administered 2019-03-04: 12:00:00 1 mL
  Filled 2019-03-04: qty 10

## 2019-03-04 MED ORDER — ONDANSETRON HCL 4 MG/2ML IJ SOLN
4.0000 mg | Freq: Once | INTRAMUSCULAR | Status: AC
  Administered 2019-03-04: 09:00:00 4 mg via INTRAVENOUS
  Filled 2019-03-04: qty 2

## 2019-03-04 MED ORDER — KETOROLAC TROMETHAMINE 30 MG/ML IJ SOLN
30.0000 mg | Freq: Once | INTRAMUSCULAR | Status: AC
  Administered 2019-03-04: 09:00:00 30 mg via INTRAVENOUS
  Filled 2019-03-04: qty 1
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
## 2019-03-04 NOTE — ED Triage Notes (Signed)
Patient c/o right flank pain since this AM. Paitent denies any urinary issues.

## 2019-03-04 NOTE — ED Notes (Signed)
Patient informed we need urine sample 

## 2019-03-04 NOTE — Discharge Instructions (Addendum)
You can take Tylenol or Ibuprofen as directed for pain. You can alternate Tylenol and Ibuprofen every 4 hours. If you take Tylenol at 1pm, then you can take Ibuprofen at 5pm. Then you can take Tylenol again at 9pm.   Make Sure you are staying hydrated and drinking plenty of fluids.  As we discussed, your CT scan did not show any new stones that are attempting to pass.  He did have some kidney stones that are in the kidney.  This may be causing some renal colic pain.  Additionally, as we discussed, you may have recently passed the stone.  You have been tested for gonorrhea/chlamydia today.  Results will come back in 2 days.  As we discussed, you have received treatment today.  If you are positive, you will not need any further treatment.  He should not have any intercourse until results come back.  Closely monitor your symptoms.  Return the emergency department for any worsening pain, nausea/vomiting, fevers or any other worsening or concerning symptoms.

## 2019-03-04 NOTE — ED Notes (Signed)
Patient given water and crackers and states he has not been nauseous after eating and drinking

## 2019-03-05 LAB — GC/CHLAMYDIA PROBE AMP (~~LOC~~) NOT AT ARMC
Chlamydia: NEGATIVE
Neisseria Gonorrhea: NEGATIVE

## 2019-05-19 ENCOUNTER — Emergency Department (HOSPITAL_COMMUNITY)
Admission: EM | Admit: 2019-05-19 | Discharge: 2019-05-19 | Disposition: A | Discharge status: Home / Self Care | Payer: Self-pay | Attending: Emergency Medicine | Admitting: Emergency Medicine

## 2019-05-19 ENCOUNTER — Encounter (HOSPITAL_COMMUNITY): Payer: Self-pay

## 2019-05-19 ENCOUNTER — Other Ambulatory Visit: Payer: Self-pay

## 2019-05-19 DIAGNOSIS — Z202 Contact with and (suspected) exposure to infections with a predominantly sexual mode of transmission: Secondary | ICD-10-CM | POA: Insufficient documentation

## 2019-05-19 MED ORDER — CEFTRIAXONE SODIUM 250 MG IJ SOLR
250.0000 mg | Freq: Once | INTRAMUSCULAR | Status: AC
  Administered 2019-05-19: 250 mg via INTRAMUSCULAR
  Filled 2019-05-19: qty 250

## 2019-05-19 MED ORDER — LIDOCAINE HCL (PF) 1 % IJ SOLN
1.0000 mL | Freq: Once | INTRAMUSCULAR | Status: AC
  Administered 2019-05-19: 1 mL
  Filled 2019-05-19: qty 30

## 2019-05-19 MED ORDER — AZITHROMYCIN 250 MG PO TABS
1000.0000 mg | ORAL_TABLET | Freq: Once | ORAL | Status: AC
  Administered 2019-05-19: 1000 mg via ORAL
  Filled 2019-05-19: qty 4

## 2019-05-19 NOTE — ED Triage Notes (Signed)
Patient c/o penile discharge x 2 days. 
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
## 2019-05-20 LAB — HIV ANTIBODY (ROUTINE TESTING W REFLEX): HIV Screen 4th Generation wRfx: NONREACTIVE

## 2019-05-20 LAB — RPR: RPR Ser Ql: NONREACTIVE

## 2019-05-21 LAB — GC/CHLAMYDIA PROBE AMP (~~LOC~~) NOT AT ARMC
Chlamydia: NEGATIVE
Neisseria Gonorrhea: NEGATIVE

## 2019-07-13 ENCOUNTER — Other Ambulatory Visit: Payer: Self-pay

## 2019-07-13 ENCOUNTER — Emergency Department (HOSPITAL_COMMUNITY)
Admission: EM | Admit: 2019-07-13 | Discharge: 2019-07-13 | Disposition: A | Discharge status: Home / Self Care | Payer: Self-pay | Attending: Emergency Medicine | Admitting: Emergency Medicine

## 2019-07-13 ENCOUNTER — Encounter (HOSPITAL_COMMUNITY): Payer: Self-pay

## 2019-07-13 DIAGNOSIS — Z202 Contact with and (suspected) exposure to infections with a predominantly sexual mode of transmission: Secondary | ICD-10-CM | POA: Insufficient documentation

## 2019-07-13 DIAGNOSIS — R369 Urethral discharge, unspecified: Secondary | ICD-10-CM | POA: Insufficient documentation

## 2019-07-13 DIAGNOSIS — F1210 Cannabis abuse, uncomplicated: Secondary | ICD-10-CM | POA: Insufficient documentation

## 2019-07-13 LAB — URINALYSIS, ROUTINE W REFLEX MICROSCOPIC
Bilirubin Urine: NEGATIVE
Glucose, UA: NEGATIVE mg/dL
Hgb urine dipstick: NEGATIVE
Ketones, ur: NEGATIVE mg/dL
Leukocytes,Ua: NEGATIVE
Nitrite: NEGATIVE
Protein, ur: NEGATIVE mg/dL
Specific Gravity, Urine: 1.021 (ref 1.005–1.030)
pH: 6 (ref 5.0–8.0)

## 2019-07-13 MED ORDER — CEFTRIAXONE SODIUM 250 MG IJ SOLR
250.0000 mg | Freq: Once | INTRAMUSCULAR | Status: AC
  Administered 2019-07-13: 250 mg via INTRAMUSCULAR
  Filled 2019-07-13: qty 250

## 2019-07-13 MED ORDER — LIDOCAINE HCL 1 % IJ SOLN
INTRAMUSCULAR | Status: AC
  Filled 2019-07-13: qty 20

## 2019-07-13 MED ORDER — AZITHROMYCIN 250 MG PO TABS
1000.0000 mg | ORAL_TABLET | Freq: Once | ORAL | Status: AC
  Administered 2019-07-13: 1000 mg via ORAL
  Filled 2019-07-13: qty 4
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
## 2019-07-13 NOTE — ED Notes (Signed)
ED Provider at bedside. 

## 2019-07-13 NOTE — Discharge Instructions (Addendum)
No sex for two full weeks AND until you and partner have no symptoms whatsoever.  Use a condom with every sexual encounter Follow up with your doctor or the health department for continued symptoms.  Please return to the ER for fevers, vomiting, new or worsening symptoms, any additional concerns.   You have been tested for HIV, syphilis, chlamydia and gonorrhea. These results will be available in approximately 3 days. You will be notified if they are positive.
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
## 2019-07-14 LAB — HIV ANTIBODY (ROUTINE TESTING W REFLEX): HIV Screen 4th Generation wRfx: NONREACTIVE

## 2019-07-14 LAB — RPR: RPR Ser Ql: NONREACTIVE

## 2019-08-22 ENCOUNTER — Emergency Department (HOSPITAL_BASED_OUTPATIENT_CLINIC_OR_DEPARTMENT_OTHER): Payer: Self-pay

## 2019-08-22 ENCOUNTER — Encounter (HOSPITAL_BASED_OUTPATIENT_CLINIC_OR_DEPARTMENT_OTHER): Payer: Self-pay | Admitting: Emergency Medicine

## 2019-08-22 ENCOUNTER — Other Ambulatory Visit: Payer: Self-pay

## 2019-08-22 ENCOUNTER — Inpatient Hospital Stay (HOSPITAL_BASED_OUTPATIENT_CLINIC_OR_DEPARTMENT_OTHER)
Admission: EM | Admit: 2019-08-22 | Discharge: 2019-08-26 | DRG: 282 | Disposition: A | Discharge status: Home / Self Care | Payer: Self-pay | Attending: Cardiovascular Disease | Admitting: Cardiovascular Disease

## 2019-08-22 DIAGNOSIS — E785 Hyperlipidemia, unspecified: Secondary | ICD-10-CM | POA: Diagnosis present

## 2019-08-22 DIAGNOSIS — R030 Elevated blood-pressure reading, without diagnosis of hypertension: Secondary | ICD-10-CM | POA: Diagnosis present

## 2019-08-22 DIAGNOSIS — E7849 Other hyperlipidemia: Secondary | ICD-10-CM | POA: Diagnosis present

## 2019-08-22 DIAGNOSIS — Z20828 Contact with and (suspected) exposure to other viral communicable diseases: Secondary | ICD-10-CM | POA: Diagnosis present

## 2019-08-22 DIAGNOSIS — Z8249 Family history of ischemic heart disease and other diseases of the circulatory system: Secondary | ICD-10-CM

## 2019-08-22 DIAGNOSIS — I249 Acute ischemic heart disease, unspecified: Secondary | ICD-10-CM

## 2019-08-22 DIAGNOSIS — I214 Non-ST elevation (NSTEMI) myocardial infarction: Principal | ICD-10-CM | POA: Diagnosis present

## 2019-08-22 DIAGNOSIS — F1290 Cannabis use, unspecified, uncomplicated: Secondary | ICD-10-CM | POA: Diagnosis present

## 2019-08-22 LAB — BASIC METABOLIC PANEL
Anion gap: 11 (ref 5–15)
BUN: 16 mg/dL (ref 6–20)
CO2: 23 mmol/L (ref 22–32)
Calcium: 10.2 mg/dL (ref 8.9–10.3)
Chloride: 102 mmol/L (ref 98–111)
Creatinine, Ser: 1.27 mg/dL — ABNORMAL HIGH (ref 0.61–1.24)
GFR calc Af Amer: >60 mL/min (ref >60)
GFR calc non Af Amer: >60 mL/min (ref >60)
Glucose, Bld: 128 mg/dL — ABNORMAL HIGH (ref 70–99)
Potassium: 3.6 mmol/L (ref 3.5–5.1)
Sodium: 136 mmol/L (ref 135–145)

## 2019-08-22 LAB — CBC
HCT: 47.3 % (ref 39.0–52.0)
Hemoglobin: 15.3 g/dL (ref 13.0–17.0)
MCH: 28.5 pg (ref 26.0–34.0)
MCHC: 32.3 g/dL (ref 30.0–36.0)
MCV: 88.1 fL (ref 80.0–100.0)
Platelets: 331 10*3/uL (ref 150–400)
RBC: 5.37 MIL/uL (ref 4.22–5.81)
RDW: 13 % (ref 11.5–15.5)
WBC: 9.3 10*3/uL (ref 4.0–10.5)
nRBC: 0 % (ref 0.0–0.2)

## 2019-08-22 LAB — TROPONIN I (HIGH SENSITIVITY)
Troponin I (High Sensitivity): 3 ng/L (ref <18)
Troponin I (High Sensitivity): 46 ng/L — ABNORMAL HIGH (ref <18)

## 2019-08-22 LAB — D-DIMER, QUANTITATIVE: D-Dimer, Quant: <0.27 ug/mL-FEU (ref 0.00–0.50)

## 2019-08-22 MED ORDER — ASPIRIN 81 MG PO CHEW
324.0000 mg | CHEWABLE_TABLET | Freq: Once | ORAL | Status: AC
  Administered 2019-08-22: 324 mg via ORAL
  Filled 2019-08-22: qty 4

## 2019-08-22 MED ORDER — NITROGLYCERIN 0.4 MG SL SUBL
0.4000 mg | SUBLINGUAL_TABLET | SUBLINGUAL | Status: DC | PRN
  Administered 2019-08-22: 0.4 mg via SUBLINGUAL
  Filled 2019-08-22: qty 1

## 2019-08-22 MED ORDER — MORPHINE SULFATE (PF) 4 MG/ML IV SOLN
4.0000 mg | Freq: Once | INTRAVENOUS | Status: AC
  Administered 2019-08-22: 19:00:00 4 mg via INTRAVENOUS
  Filled 2019-08-22: qty 1

## 2019-08-22 MED ORDER — KETOROLAC TROMETHAMINE 15 MG/ML IJ SOLN
15.0000 mg | Freq: Once | INTRAMUSCULAR | Status: AC
  Administered 2019-08-22: 20:00:00 15 mg via INTRAVENOUS

## 2019-08-22 MED ORDER — HEPARIN (PORCINE) 25000 UT/250ML-% IV SOLN
1050.0000 [IU]/h | INTRAVENOUS | Status: DC
  Administered 2019-08-22 – 2019-08-24 (×3): 1050 [IU]/h via INTRAVENOUS
  Filled 2019-08-22 (×3): qty 250

## 2019-08-22 MED ORDER — HEPARIN BOLUS VIA INFUSION
4000.0000 [IU] | Freq: Once | INTRAVENOUS | Status: AC
  Administered 2019-08-22: 4000 [IU] via INTRAVENOUS

## 2019-08-22 MED ORDER — KETOROLAC TROMETHAMINE 15 MG/ML IJ SOLN
INTRAMUSCULAR | Status: AC
  Administered 2019-08-22: 20:00:00 15 mg via INTRAVENOUS
  Filled 2019-08-22: qty 1

## 2019-08-22 NOTE — ED Notes (Signed)
Called [name] at [facility] for inpatient cardiology consult.

## 2019-08-22 NOTE — ED Notes (Signed)
Heparin bolus and drip verified by this RN and Caryl Pina, Rn
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
## 2019-08-22 NOTE — ED Triage Notes (Signed)
Sharp chest pain in middle of chest that started suddenly about 20 min ago while walking up stairs.  No radiation of pain.  Sts his arms are weak.  Sts his chest is "pounding".  Denies any drug use.

## 2019-08-23 ENCOUNTER — Inpatient Hospital Stay (HOSPITAL_COMMUNITY): Payer: Self-pay

## 2019-08-23 ENCOUNTER — Encounter (HOSPITAL_COMMUNITY): Payer: Self-pay | Admitting: Family Medicine

## 2019-08-23 DIAGNOSIS — I214 Non-ST elevation (NSTEMI) myocardial infarction: Principal | ICD-10-CM

## 2019-08-23 DIAGNOSIS — R079 Chest pain, unspecified: Secondary | ICD-10-CM

## 2019-08-23 DIAGNOSIS — I209 Angina pectoris, unspecified: Secondary | ICD-10-CM

## 2019-08-23 DIAGNOSIS — E785 Hyperlipidemia, unspecified: Secondary | ICD-10-CM | POA: Diagnosis present

## 2019-08-23 HISTORY — DX: Hyperlipidemia, unspecified: E78.5

## 2019-08-23 LAB — BASIC METABOLIC PANEL
Anion gap: 12 (ref 5–15)
BUN: 15 mg/dL (ref 6–20)
CO2: 24 mmol/L (ref 22–32)
Calcium: 9.9 mg/dL (ref 8.9–10.3)
Chloride: 102 mmol/L (ref 98–111)
Creatinine, Ser: 1.21 mg/dL (ref 0.61–1.24)
GFR calc Af Amer: >60 mL/min (ref >60)
GFR calc non Af Amer: >60 mL/min (ref >60)
Glucose, Bld: 102 mg/dL — ABNORMAL HIGH (ref 70–99)
Potassium: 4.1 mmol/L (ref 3.5–5.1)
Sodium: 138 mmol/L (ref 135–145)

## 2019-08-23 LAB — APTT: aPTT: 24 s (ref 24–36)

## 2019-08-23 LAB — HEPARIN LEVEL (UNFRACTIONATED)
Heparin Unfractionated: 0.53 [IU]/mL (ref 0.30–0.70)
Heparin Unfractionated: 0.42 IU/mL (ref 0.30–0.70)

## 2019-08-23 LAB — SARS CORONAVIRUS 2 (TAT 6-24 HRS): SARS Coronavirus 2: NEGATIVE

## 2019-08-23 LAB — SEDIMENTATION RATE: Sed Rate: 1 mm/hr (ref 0–16)

## 2019-08-23 LAB — ECHOCARDIOGRAM COMPLETE
Height: 66 in
Weight: 2739.2 oz

## 2019-08-23 LAB — PROTIME-INR
INR: 0.9 (ref 0.8–1.2)
Prothrombin Time: 12.3 seconds (ref 11.4–15.2)

## 2019-08-23 LAB — TROPONIN I (HIGH SENSITIVITY): Troponin I (High Sensitivity): 6186 ng/L

## 2019-08-23 LAB — C-REACTIVE PROTEIN: CRP: <0.8 mg/dL (ref <1.0)

## 2019-08-23 MED ORDER — ASPIRIN EC 81 MG PO TBEC
81.0000 mg | DELAYED_RELEASE_TABLET | Freq: Every day | ORAL | Status: DC
  Administered 2019-08-24 – 2019-08-26 (×2): 81 mg via ORAL
  Filled 2019-08-23 (×2): qty 1

## 2019-08-23 MED ORDER — ACETAMINOPHEN 325 MG PO TABS: 650.0000 mg | ORAL_TABLET | ORAL | Status: DC | PRN

## 2019-08-23 MED ORDER — MORPHINE SULFATE (PF) 4 MG/ML IV SOLN: 4.0000 mg | INTRAVENOUS | Status: DC | PRN

## 2019-08-23 MED ORDER — POTASSIUM CHLORIDE IN NACL 20-0.9 MEQ/L-% IV SOLN
INTRAVENOUS | Status: AC
  Administered 2019-08-23: 05:00:00 via INTRAVENOUS
  Filled 2019-08-23: qty 1000

## 2019-08-23 MED ORDER — HYDROCODONE-ACETAMINOPHEN 5-325 MG PO TABS: 1.0000 | ORAL_TABLET | ORAL | Status: DC | PRN

## 2019-08-23 MED ORDER — ONDANSETRON HCL 4 MG/2ML IJ SOLN: 4.0000 mg | Freq: Four times a day (QID) | INTRAMUSCULAR | Status: DC | PRN

## 2019-08-23 NOTE — Plan of Care (Signed)
  Problem: Clinical Measurements: Goal: Will remain free from infection Outcome: Progressing Goal: Diagnostic test results will improve Outcome: Progressing Goal: Cardiovascular complication will be avoided Outcome: Progressing   Problem: Activity: Goal: Ability to tolerate increased activity will improve Outcome: Progressing
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
## 2019-08-23 NOTE — Progress Notes (Signed)
  Echocardiogram 2D Echocardiogram has been performed.  Paul Chandler 08/23/2019, 12:57 PM
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
## 2019-08-23 NOTE — Progress Notes (Signed)
This patient currently has no medical problems other than ACS as this time. I have spoken with Dr Acie Fredrickson who agrees to take him on the cardiology service.  TR[facility] will sign off.   Thank you.   Debbe Odea, MD

## 2019-08-23 NOTE — Progress Notes (Signed)
CRITICAL VALUE ALERT  Critical Value: Troponin 6,186  MD notified.   Gerarda Fraction, RN

## 2019-08-23 NOTE — Progress Notes (Signed)
Patient had uneventful day without chest pain.

## 2019-08-23 NOTE — ED Notes (Signed)
[facility] here at this time for transport to [facility].

## 2019-08-23 NOTE — ED Notes (Signed)
Report given to [facility] RN at [facility] has left with patient.

## 2019-08-23 NOTE — Progress Notes (Signed)
    Patient remains pain-free.  His EKG shows slight early repolarization but today's EKG is unchanged from yesterday.  Troponins are elevated as noted in the note by Dr. Georgette Shell.  Echocardiogram has been performed and we will read the echo soon.  At this point I am inclined to keep him for heart catheterization on Monday.  We will allow him to eat now.  He appears to be very stable. We will allow him to get up in the room but will hold off on any significant ambulation at least until tomorrow.    Mertie Moores, MD  08/23/2019 1:04 PM    [facility] [place],  [address], [place]  [address] Phone: [phone]; Fax: [phone]
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
## 2019-08-24 LAB — TROPONIN I (HIGH SENSITIVITY)
Troponin I (High Sensitivity): 4107 ng/L (ref <18)
Troponin I (High Sensitivity): 4215 ng/L (ref <18)

## 2019-08-24 LAB — CBC
HCT: 51.1 % (ref 39.0–52.0)
Hemoglobin: 16.3 g/dL (ref 13.0–17.0)
MCH: 28.4 pg (ref 26.0–34.0)
MCHC: 31.9 g/dL (ref 30.0–36.0)
MCV: 89.2 fL (ref 80.0–100.0)
Platelets: 322 10*3/uL (ref 150–400)
RBC: 5.73 MIL/uL (ref 4.22–5.81)
RDW: 13 % (ref 11.5–15.5)
WBC: 6.8 10*3/uL (ref 4.0–10.5)
nRBC: 0 % (ref 0.0–0.2)

## 2019-08-24 LAB — HEPARIN LEVEL (UNFRACTIONATED): Heparin Unfractionated: 0.62 IU/mL (ref 0.30–0.70)

## 2019-08-24 NOTE — Plan of Care (Signed)
  Problem: Clinical Measurements: Goal: Will remain free from infection Outcome: Progressing Goal: Cardiovascular complication will be avoided Outcome: Progressing   Problem: Pain Managment: Goal: General experience of comfort will improve Outcome: Progressing   Problem: Activity: Goal: Ability to tolerate increased activity will improve Outcome: Progressing

## 2019-08-24 NOTE — H&P (View-Only) (Signed)
Progress Note  Patient Name: Paul Chandler Date of Encounter: 08/24/2019  Primary Cardiologist ; [place]   Subjective   28 year old gentleman who was admitted with episodes of chest pain.  Was found to have positive troponin levels.  Last troponin level is 6196.  Schedule heart catheterization tomorrow. CP was left sided, lasted for several hours,  Squeezing like cp  No cp at present   Inpatient Medications    Scheduled Meds: . aspirin EC  81 mg Oral Daily   Continuous Infusions: . heparin 1,050 Units/hr (08/23/19 1736)   PRN Meds: acetaminophen, HYDROcodone-acetaminophen, morphine injection, nitroGLYCERIN, ondansetron (ZOFRAN) IV   Vital Signs    Vitals:   08/23/19 1335 08/23/19 2020 08/24/19 0509 08/24/19 0900  BP: 126/85 (!) 141/94 134/84 (!) 135/94  Pulse: 61 81 74 78  Resp: 16 16 16 16   Temp: 98.7 F (37.1 C) 98.8 F (37.1 C) 97.6 F (36.4 C)   TempSrc: Oral Oral Oral   SpO2: 99% 99% 100% 100%  Weight:   77.8 kg   Height:        Intake/Output Summary (Last 24 hours) at 08/24/2019 1252 Last data filed at 08/24/2019 0900 Gross per 24 hour  Intake 249.37 ml  Output 1000 ml  Net -750.63 ml   Last 3 Weights 08/24/2019 08/23/2019 08/22/2019  Weight (lbs) 171 lb 8 oz 171 lb 3.2 oz 180 lb  Weight (kg) 77.792 kg 77.656 kg 81.647 kg  Some encounter information is confidential and restricted. Go to Review Flowsheets activity to see all data.      Telemetry    NSR - Personally Reviewed  ECG      - Personally Reviewed  Physical Exam   GEN: No acute distress.  Young man  Neck: No JVD Cardiac: RRR, no murmurs, rubs, or gallops.  Respiratory: Clear to auscultation bilaterally. GI: Soft, nontender, non-distended  MS: No edema; No deformity. Neuro:  Nonfocal  Psych: Normal affect   Labs    High Sensitivity Troponin:   Recent Labs  Lab 08/22/19 1803 08/22/19 2042 08/23/19 0406  TROPONINIHS 3 46* 6,186*      Chemistry Recent Labs  Lab  08/22/19 1803 08/23/19 0406  NA 136 138  K 3.6 4.1  CL 102 102  CO2 23 24  GLUCOSE 128* 102*  BUN 16 15  CREATININE 1.27* 1.21  CALCIUM 10.2 9.9  GFRNONAA >60 >60  GFRAA >60 >60  ANIONGAP 11 12     Hematology Recent Labs  Lab 08/22/19 1803 08/24/19 0502  WBC 9.3 6.8  RBC 5.37 5.73  HGB 15.3 16.3  HCT 47.3 51.1  MCV 88.1 89.2  MCH 28.5 28.4  MCHC 32.3 31.9  RDW 13.0 13.0  PLT 331 322    BNPNo results for input(s): BNP, PROBNP in the last 168 hours.   DDimer  Recent Labs  Lab 08/22/19 1803  DDIMER <0.27     Radiology    Dg Chest 2 View  Result Date: 08/22/2019 CLINICAL DATA:  Chest pain EXAM: CHEST - 2 VIEW COMPARISON:  07/01/2015 FINDINGS: The heart size and mediastinal contours are within normal limits. Both lungs are clear. The visualized skeletal structures are unremarkable. IMPRESSION: No active cardiopulmonary disease. Electronically Signed   By: 08/31/2015 M.D.   On: 08/22/2019 18:30    Cardiac Studies      Patient Profile     28 y.o. male admitted with several hours of chest pain with a subsequent troponin level of 6000.  Assessment & Plan    1.  Chest pain: The patient was admitted with chest pain.  His troponin levels rose to 6000.  I have ordered some repeat troponin levels to see what the pattern is.  His EKG did not show any acute changes but he does have early repolarization changes.  I think that we need to proceed with heart catheterization tomorrow.  He is a young man but may have had spontaneous coronary artery dissection.  He does not have any pleuritic chest pain so I doubt that this is myopericarditis.    For questions or updates, please contact [place] Please consult [email or web address] for contact info under        Signed, Mertie Moores, MD  08/24/2019, 12:52 PM

## 2019-08-24 NOTE — Progress Notes (Signed)
 Progress Note  Patient Name: Paul Chandler Date of Encounter: 08/24/2019  Primary Cardiologist ; New , Annalise Mcdiarmid   Subjective   28-year-old gentleman who was admitted with episodes of chest pain.  Was found to have positive troponin levels.  Last troponin level is 6196.  Schedule heart catheterization tomorrow. CP was left sided, lasted for several hours,  Squeezing like cp  No cp at present   Inpatient Medications    Scheduled Meds: . aspirin EC  81 mg Oral Daily   Continuous Infusions: . heparin 1,050 Units/hr (08/23/19 1736)   PRN Meds: acetaminophen, HYDROcodone-acetaminophen, morphine injection, nitroGLYCERIN, ondansetron (ZOFRAN) IV   Vital Signs    Vitals:   08/23/19 1335 08/23/19 2020 08/24/19 0509 08/24/19 0900  BP: 126/85 (!) 141/94 134/84 (!) 135/94  Pulse: 61 81 74 78  Resp: 16 16 16 16  Temp: 98.7 F (37.1 C) 98.8 F (37.1 C) 97.6 F (36.4 C)   TempSrc: Oral Oral Oral   SpO2: 99% 99% 100% 100%  Weight:   77.8 kg   Height:        Intake/Output Summary (Last 24 hours) at 08/24/2019 1252 Last data filed at 08/24/2019 0900 Gross per 24 hour  Intake 249.37 ml  Output 1000 ml  Net -750.63 ml   Last 3 Weights 08/24/2019 08/23/2019 08/22/2019  Weight (lbs) 171 lb 8 oz 171 lb 3.2 oz 180 lb  Weight (kg) 77.792 kg 77.656 kg 81.647 kg  Some encounter information is confidential and restricted. Go to Review Flowsheets activity to see all data.      Telemetry    NSR - Personally Reviewed  ECG      - Personally Reviewed  Physical Exam   GEN: No acute distress.  Young man  Neck: No JVD Cardiac: RRR, no murmurs, rubs, or gallops.  Respiratory: Clear to auscultation bilaterally. GI: Soft, nontender, non-distended  MS: No edema; No deformity. Neuro:  Nonfocal  Psych: Normal affect   Labs    High Sensitivity Troponin:   Recent Labs  Lab 08/22/19 1803 08/22/19 2042 08/23/19 0406  TROPONINIHS 3 46* 6,186*      Chemistry Recent Labs  Lab  08/22/19 1803 08/23/19 0406  NA 136 138  K 3.6 4.1  CL 102 102  CO2 23 24  GLUCOSE 128* 102*  BUN 16 15  CREATININE 1.27* 1.21  CALCIUM 10.2 9.9  GFRNONAA >60 >60  GFRAA >60 >60  ANIONGAP 11 12     Hematology Recent Labs  Lab 08/22/19 1803 08/24/19 0502  WBC 9.3 6.8  RBC 5.37 5.73  HGB 15.3 16.3  HCT 47.3 51.1  MCV 88.1 89.2  MCH 28.5 28.4  MCHC 32.3 31.9  RDW 13.0 13.0  PLT 331 322    BNPNo results for input(s): BNP, PROBNP in the last 168 hours.   DDimer  Recent Labs  Lab 08/22/19 1803  DDIMER <0.27     Radiology    Dg Chest 2 View  Result Date: 08/22/2019 CLINICAL DATA:  Chest pain EXAM: CHEST - 2 VIEW COMPARISON:  07/01/2015 FINDINGS: The heart size and mediastinal contours are within normal limits. Both lungs are clear. The visualized skeletal structures are unremarkable. IMPRESSION: No active cardiopulmonary disease. Electronically Signed   By: Kim  Fujinaga M.D.   On: 08/22/2019 18:30    Cardiac Studies      Patient Profile     28 y.o. male admitted with several hours of chest pain with a subsequent troponin level of 6000.    Assessment & Plan    1.  Chest pain: The patient was admitted with chest pain.  His troponin levels rose to 6000.  I have ordered some repeat troponin levels to see what the pattern is.  His EKG did not show any acute changes but he does have early repolarization changes.  I think that we need to proceed with heart catheterization tomorrow.  He is a young man but may have had spontaneous coronary artery dissection.  He does not have any pleuritic chest pain so I doubt that this is myopericarditis.    For questions or updates, please contact [place] Please consult [email or web address] for contact info under        Signed, Mertie Moores, MD  08/24/2019, 12:52 PM

## 2019-08-24 NOTE — Progress Notes (Signed)
Patient has had uneventful day without chest pain.  Patient ambulated in room today.
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
## 2019-08-25 ENCOUNTER — Encounter (HOSPITAL_COMMUNITY)
Admission: EM | Disposition: A | Discharge status: Home / Self Care | Payer: Self-pay | Source: Home / Self Care | Attending: Cardiovascular Disease

## 2019-08-25 DIAGNOSIS — E7800 Pure hypercholesterolemia, unspecified: Secondary | ICD-10-CM

## 2019-08-25 DIAGNOSIS — R030 Elevated blood-pressure reading, without diagnosis of hypertension: Secondary | ICD-10-CM

## 2019-08-25 DIAGNOSIS — I2510 Atherosclerotic heart disease of native coronary artery without angina pectoris: Secondary | ICD-10-CM

## 2019-08-25 DIAGNOSIS — I214 Non-ST elevation (NSTEMI) myocardial infarction: Secondary | ICD-10-CM

## 2019-08-25 HISTORY — DX: Non-ST elevation (NSTEMI) myocardial infarction: I21.4

## 2019-08-25 HISTORY — PX: LEFT HEART CATH AND CORONARY ANGIOGRAPHY: CATH118249

## 2019-08-25 LAB — CBC
HCT: 48.8 % (ref 39.0–52.0)
HCT: 49.1 % (ref 39.0–52.0)
Hemoglobin: 16.1 g/dL (ref 13.0–17.0)
Hemoglobin: 16.3 g/dL (ref 13.0–17.0)
MCH: 28.8 pg (ref 26.0–34.0)
MCH: 29.1 pg (ref 26.0–34.0)
MCHC: 33 g/dL (ref 30.0–36.0)
MCHC: 33.2 g/dL (ref 30.0–36.0)
MCV: 87.1 fL (ref 80.0–100.0)
MCV: 87.7 fL (ref 80.0–100.0)
Platelets: 311 10*3/uL (ref 150–400)
Platelets: 310 10*3/uL (ref 150–400)
RBC: 5.6 MIL/uL (ref 4.22–5.81)
RBC: 5.6 MIL/uL (ref 4.22–5.81)
RDW: 12.6 % (ref 11.5–15.5)
RDW: 12.5 % (ref 11.5–15.5)
WBC: 6.1 10*3/uL (ref 4.0–10.5)
WBC: 5.9 10*3/uL (ref 4.0–10.5)
nRBC: 0 % (ref 0.0–0.2)
nRBC: 0 % (ref 0.0–0.2)

## 2019-08-25 LAB — LIPID PANEL
Cholesterol: 312 mg/dL — ABNORMAL HIGH (ref 0–200)
HDL: 46 mg/dL (ref >40)
LDL Cholesterol: 234 mg/dL — ABNORMAL HIGH (ref 0–99)
Total CHOL/HDL Ratio: 6.8 RATIO
Triglycerides: 161 mg/dL — ABNORMAL HIGH (ref <150)
VLDL: 32 mg/dL (ref 0–40)

## 2019-08-25 LAB — HEPARIN LEVEL (UNFRACTIONATED): Heparin Unfractionated: 0.51 IU/mL (ref 0.30–0.70)

## 2019-08-25 LAB — BASIC METABOLIC PANEL
Anion gap: 10 (ref 5–15)
BUN: 16 mg/dL (ref 6–20)
CO2: 25 mmol/L (ref 22–32)
Calcium: 10.1 mg/dL (ref 8.9–10.3)
Chloride: 103 mmol/L (ref 98–111)
Creatinine, Ser: 1.18 mg/dL (ref 0.61–1.24)
GFR calc Af Amer: >60 mL/min (ref >60)
GFR calc non Af Amer: >60 mL/min (ref >60)
Glucose, Bld: 107 mg/dL — ABNORMAL HIGH (ref 70–99)
Potassium: 4.3 mmol/L (ref 3.5–5.1)
Sodium: 138 mmol/L (ref 135–145)

## 2019-08-25 SURGERY — LEFT HEART CATH AND CORONARY ANGIOGRAPHY
Anesthesia: LOCAL

## 2019-08-25 MED ORDER — TIROFIBAN HCL IN NACL 5-0.9 MG/100ML-% IV SOLN
INTRAVENOUS | Status: AC | PRN
  Administered 2019-08-25: 0.15 ug/kg/min via INTRAVENOUS

## 2019-08-25 MED ORDER — SODIUM CHLORIDE 0.9% FLUSH: 3.0000 mL | INTRAVENOUS | Status: DC | PRN

## 2019-08-25 MED ORDER — HEPARIN SODIUM (PORCINE) 1000 UNIT/ML IJ SOLN
INTRAMUSCULAR | Status: AC
  Filled 2019-08-25: qty 1

## 2019-08-25 MED ORDER — FENTANYL CITRATE (PF) 100 MCG/2ML IJ SOLN
INTRAMUSCULAR | Status: DC | PRN
  Administered 2019-08-25: 50 ug via INTRAVENOUS

## 2019-08-25 MED ORDER — HYDRALAZINE HCL 20 MG/ML IJ SOLN: 10.0000 mg | INTRAMUSCULAR | Status: AC | PRN

## 2019-08-25 MED ORDER — LABETALOL HCL 5 MG/ML IV SOLN: 10.0000 mg | INTRAVENOUS | Status: AC | PRN

## 2019-08-25 MED ORDER — SODIUM CHLORIDE 0.9 % IV SOLN: INTRAVENOUS | Status: AC

## 2019-08-25 MED ORDER — HEPARIN (PORCINE) IN NACL 1000-0.9 UT/500ML-% IV SOLN
INTRAVENOUS | Status: AC
  Filled 2019-08-25: qty 1000

## 2019-08-25 MED ORDER — SODIUM CHLORIDE 0.9 % WEIGHT BASED INFUSION
3.0000 mL/kg/h | INTRAVENOUS | Status: DC
  Administered 2019-08-25: 3 mL/kg/h via INTRAVENOUS

## 2019-08-25 MED ORDER — HEPARIN (PORCINE) IN NACL 1000-0.9 UT/500ML-% IV SOLN
INTRAVENOUS | Status: DC | PRN
  Administered 2019-08-25: 500 mL

## 2019-08-25 MED ORDER — LIDOCAINE HCL (PF) 1 % IJ SOLN
INTRAMUSCULAR | Status: DC | PRN
  Administered 2019-08-25: 3 mL

## 2019-08-25 MED ORDER — VERAPAMIL HCL 2.5 MG/ML IV SOLN
INTRAVENOUS | Status: AC
  Filled 2019-08-25: qty 2

## 2019-08-25 MED ORDER — ATORVASTATIN CALCIUM 40 MG PO TABS
40.0000 mg | ORAL_TABLET | Freq: Every day | ORAL | Status: DC
  Administered 2019-08-25: 40 mg via ORAL
  Filled 2019-08-25: qty 1

## 2019-08-25 MED ORDER — TIROFIBAN HCL IN NACL 5-0.9 MG/100ML-% IV SOLN
INTRAVENOUS | Status: AC
  Filled 2019-08-25: qty 100

## 2019-08-25 MED ORDER — SODIUM CHLORIDE 0.9 % WEIGHT BASED INFUSION: 1.0000 mL/kg/h | INTRAVENOUS | Status: DC

## 2019-08-25 MED ORDER — SODIUM CHLORIDE 0.9 % IV SOLN: 250.0000 mL | INTRAVENOUS | Status: DC | PRN

## 2019-08-25 MED ORDER — ASPIRIN 81 MG PO CHEW
81.0000 mg | CHEWABLE_TABLET | ORAL | Status: AC
  Administered 2019-08-25: 81 mg via ORAL
  Filled 2019-08-25: qty 1

## 2019-08-25 MED ORDER — MIDAZOLAM HCL 2 MG/2ML IJ SOLN
INTRAMUSCULAR | Status: DC | PRN
  Administered 2019-08-25: 1 mg via INTRAVENOUS

## 2019-08-25 MED ORDER — LIDOCAINE HCL (PF) 1 % IJ SOLN
INTRAMUSCULAR | Status: AC
  Filled 2019-08-25: qty 30

## 2019-08-25 MED ORDER — CLOPIDOGREL BISULFATE 75 MG PO TABS
300.0000 mg | ORAL_TABLET | Freq: Once | ORAL | Status: AC
  Administered 2019-08-25: 300 mg via ORAL
  Filled 2019-08-25: qty 4

## 2019-08-25 MED ORDER — CLOPIDOGREL BISULFATE 75 MG PO TABS
75.0000 mg | ORAL_TABLET | Freq: Every day | ORAL | Status: DC
  Administered 2019-08-26: 09:00:00 75 mg via ORAL
  Filled 2019-08-25: qty 1

## 2019-08-25 MED ORDER — FENTANYL CITRATE (PF) 100 MCG/2ML IJ SOLN
INTRAMUSCULAR | Status: AC
  Filled 2019-08-25: qty 2

## 2019-08-25 MED ORDER — VERAPAMIL HCL 2.5 MG/ML IV SOLN
INTRAVENOUS | Status: DC | PRN
  Administered 2019-08-25: 10 mL via INTRA_ARTERIAL

## 2019-08-25 MED ORDER — SODIUM CHLORIDE 0.9% FLUSH
3.0000 mL | Freq: Two times a day (BID) | INTRAVENOUS | Status: DC
  Administered 2019-08-25 – 2019-08-26 (×2): 3 mL via INTRAVENOUS

## 2019-08-25 MED ORDER — TIROFIBAN HCL IN NACL 5-0.9 MG/100ML-% IV SOLN
0.1500 ug/kg/min | INTRAVENOUS | Status: AC
  Administered 2019-08-25 – 2019-08-26 (×3): 0.15 ug/kg/min via INTRAVENOUS
  Filled 2019-08-25 (×3): qty 100

## 2019-08-25 MED ORDER — MIDAZOLAM HCL 2 MG/2ML IJ SOLN
INTRAMUSCULAR | Status: AC
  Filled 2019-08-25: qty 2

## 2019-08-25 MED ORDER — SODIUM CHLORIDE 0.9% FLUSH: 3.0000 mL | Freq: Two times a day (BID) | INTRAVENOUS | Status: DC

## 2019-08-25 MED ORDER — IOHEXOL 350 MG/ML SOLN
INTRAVENOUS | Status: DC | PRN
  Administered 2019-08-25: 40 mL via INTRA_ARTERIAL

## 2019-08-25 MED ORDER — HEPARIN SODIUM (PORCINE) 1000 UNIT/ML IJ SOLN
INTRAMUSCULAR | Status: DC | PRN
  Administered 2019-08-25: 4000 [IU] via INTRAVENOUS

## 2019-08-25 MED ORDER — TIROFIBAN (AGGRASTAT) BOLUS VIA INFUSION
INTRAVENOUS | Status: DC | PRN
  Administered 2019-08-25: 1922.5 ug via INTRAVENOUS

## 2019-08-25 SURGICAL SUPPLY — 9 items
CATH 5FR JL3.5 JR4 ANG PIG MP (CATHETERS) ×2 IMPLANT
DEVICE RAD COMP TR BAND LRG (VASCULAR PRODUCTS) ×2 IMPLANT
GLIDESHEATH SLEND SS 6F .021 (SHEATH) ×2 IMPLANT
GUIDEWIRE INQWIRE 1.5J.035X260 (WIRE) ×1 IMPLANT
INQWIRE 1.5J .035X260CM (WIRE) ×2
KIT HEART LEFT (KITS) ×2 IMPLANT
PACK CARDIAC CATHETERIZATION (CUSTOM PROCEDURE TRAY) ×2 IMPLANT
TRANSDUCER W/STOPCOCK (MISCELLANEOUS) ×2 IMPLANT
TUBING CIL FLEX 10 FLL-RA (TUBING) ×2 IMPLANT

## 2019-08-25 NOTE — Progress Notes (Signed)
Progress Note  Patient Name: Paul Chandler Date of Encounter: 08/25/2019  Primary Cardiologist: No primary care provider on file. New- Dr. Acie Fredrickson  Subjective   Denies chest pain.  Feeling much better.  No shortness of breath.  Inpatient Medications    Scheduled Meds:  aspirin EC  81 mg Oral Daily   atorvastatin  40 mg Oral q1800   [START ON 08/26/2019] clopidogrel  75 mg Oral Q breakfast   sodium chloride flush  3 mL Intravenous Q12H   Continuous Infusions:  sodium chloride     tirofiban 0.15 mcg/kg/min (08/25/19 1106)   PRN Meds: sodium chloride, acetaminophen, hydrALAZINE, HYDROcodone-acetaminophen, labetalol, nitroGLYCERIN, ondansetron (ZOFRAN) IV, sodium chloride flush   Vital Signs    Vitals:   08/25/19 0904 08/25/19 0909 08/25/19 0914 08/25/19 0917  BP: 129/90 126/87 (!) 130/94 (!) 131/93  Pulse: 80 72 79 73  Resp: (!) 8 12 19  (!) 21  Temp:      TempSrc:      SpO2: 99% 100% 99% 100%  Weight:      Height:        Intake/Output Summary (Last 24 hours) at 08/25/2019 1500 Last data filed at 08/25/2019 0824 Gross per 24 hour  Intake 1162.37 ml  Output 250 ml  Net 912.37 ml   Last 3 Weights 08/25/2019 08/24/2019 08/23/2019  Weight (lbs) 169 lb 8 oz 171 lb 8 oz 171 lb 3.2 oz  Weight (kg) 76.885 kg 77.792 kg 77.656 kg  Some encounter information is confidential and restricted. Go to Review Flowsheets activity to see all data.      Telemetry    Sinus rhythm - Personally Reviewed  ECG    n/a - Personally Reviewed  Physical Exam   VS:  BP (!) 131/93    Pulse 73    Temp 98.2 F (36.8 C) (Oral)    Resp (!) 21    Ht 5\' 6"  (1.676 m)    Wt 76.9 kg    SpO2 100%    BMI 27.36 kg/m  , BMI Body mass index is 27.36 kg/m. GENERAL:  Well appearing.  No acute distress HEENT: Pupils equal round and reactive, fundi not visualized, oral mucosa unremarkable NECK:  No jugular venous distention, waveform within normal limits, carotid upstroke brisk and symmetric, no  bruits LUNGS:  Clear to auscultation bilaterally HEART:  RRR.  PMI not displaced or sustained,S1 and S2 within normal limits, no S3, no S4, no clicks, no rubs, no murmurs ABD:  Flat, positive bowel sounds normal in frequency in pitch, no bruits, no rebound, no guarding, no midline pulsatile mass, no hepatomegaly, no splenomegaly EXT:  2 plus pulses throughout, no edema, no cyanosis no clubbing SKIN:  No rashes no nodules NEURO:  Cranial nerves II through XII grossly intact, motor grossly intact throughout PSYCH:  Cognitively intact, oriented to person place and time   Labs    High Sensitivity Troponin:   Recent Labs  Lab 08/22/19 1803 08/22/19 2042 08/23/19 0406 08/24/19 1336 08/24/19 1845  TROPONINIHS 3 46* 6,186* 4,107* 4,215*      Chemistry Recent Labs  Lab 08/22/19 1803 08/23/19 0406 08/25/19 0538  NA 136 138 138  K 3.6 4.1 4.3  CL 102 102 103  CO2 23 24 25   GLUCOSE 128* 102* 107*  BUN 16 15 16   CREATININE 1.27* 1.21 1.18  CALCIUM 10.2 9.9 10.1  GFRNONAA >60 >60 >60  GFRAA >60 >60 >60  ANIONGAP 11 12 10  Hematology Recent Labs  Lab 08/22/19 1803 08/24/19 0502 08/25/19 0442  WBC 9.3 6.8 6.1  RBC 5.37 5.73 5.60  HGB 15.3 16.3 16.1  HCT 47.3 51.1 48.8  MCV 88.1 89.2 87.1  MCH 28.5 28.4 28.8  MCHC 32.3 31.9 33.0  RDW 13.0 13.0 12.6  PLT 331 322 311    BNPNo results for input(s): BNP, PROBNP in the last 168 hours.   DDimer  Recent Labs  Lab 08/22/19 1803  DDIMER <0.27     Radiology    No results found.  Cardiac Studies   Echo 08/23/19: IMPRESSIONS   1. Left ventricular ejection fraction, by visual estimation, is 60 to 65%. The left ventricle has normal function. There is no left ventricular hypertrophy.  2. Global right ventricle has normal systolic function.The right ventricular size is normal. No increase in right ventricular wall thickness.  3. Left atrial size was normal.  4. Right atrial size was normal.  5. The mitral valve is  normal in structure. Trace mitral valve regurgitation.  6. The tricuspid valve is normal in structure. Tricuspid valve regurgitation is not demonstrated.  7. The aortic valve is normal in structure. Aortic valve regurgitation is not visualized.  8. The pulmonic valve was normal in structure. Pulmonic valve regurgitation is not visualized.  9. The atrial septum is grossly normal.  LHC 08/25/19: FINDINGS: 1. Hazy filling defect in the proximal LAD with <50% luminal narrowing suspicious for non-occlusive thrombus.  Otherwise, no angiographically significant CAD. 2. Normal left ventricular filling pressure.  RECOMMENDATIONS: 1. Images reviewed with interventional team; we will manage medically with antiplatelet therapy and risk factor modification: continue tirofiban infusion x 18 hours and DAPT with aspirin and clopidogrel x 12 months. 2. Secondary prevention with statin therapy. 3. Check lipid panel and urine drug screen.  Patient Profile     28 y.o. male with hyperlipidemia admitted with NSTEMI.  Assessment & Plan    # NSTEMI:  [name] was found to have thrombus in the proximal LAD.  Suspect that it was previously more occlusive and has started to dissolve.  He is feeling much better.  Continue ASA and clopidogrel x12 months.  Continue tirofiban per Dr. Laurelyn Sickle orders.    # Hyperlipidemia:  Likely familial hyperlipidemia. LDL was 234 on admission.  He was started on atorvastatin.  We will increase this to 80mg .  He will need repeat lipids in 6-8 weeks.  Goal LDL <70.    # Elevated Blood pressure: BP has been borderline.  We discussed diet and exercise.  If it remains >130/80 will need to start antihypertensives.   For questions or updates, please contact [facility] Please consult [email or web address] for contact info under        Signed, , MD  08/25/2019, 3:00 PM

## 2019-08-25 NOTE — Plan of Care (Signed)
  Problem: Activity: Goal: Ability to return to baseline activity level will improve Outcome: Completed/Met   Problem: Cardiovascular: Goal: Ability to achieve and maintain adequate cardiovascular perfusion will improve Outcome: Completed/Met Goal: Vascular access site(s) Level 0-1 will be maintained Outcome: Completed/Met   

## 2019-08-25 NOTE — Progress Notes (Signed)
Pharmacy note: tirofiban  28 yo male with CP s/p cath with LAD disease and possible non-occlusive thrombus.  Pharmacy consulted to dose tirofiban (continuing 18 hours post cath). -tirofiban started in cath  Plan -tirofiban 0.88mcg/kg/hr for 18 hours -CBC in 8 hours  Hildred Laser, PharmD Clinical Pharmacist **Pharmacist phone directory can now be found on [email or web address] ([email or web address]).  Listed under [place].

## 2019-08-25 NOTE — Brief Op Note (Signed)
BRIEF CARDIAC CATHETERIZATION NOTE  DATE: 08/25/2019  TIME: 9:22 AM  PATIENT:  Paul Chandler  28 y.o. male  PRE-OPERATIVE DIAGNOSIS:  NSTEMI  POST-OPERATIVE DIAGNOSIS:  Same  PROCEDURE:  Procedure(s): LEFT HEART CATH AND CORONARY ANGIOGRAPHY (N/A)  SURGEON:  Surgeon(s) and Role:    * Ayron Fillinger, MD - Primary  FINDINGS: 1. Hazy filling defect in the proximal LAD with <50% luminal narrowing suspicious for non-occlusive thrombus.  Otherwise, no angiographically significant CAD. 2. Normal left ventricular filling pressure.  RECOMMENDATIONS: 1. Images reviewed with interventional team; we will manage medically with antiplatelet therapy and risk factor modification: continue tirofiban infusion x 18 hours and DAPT with aspirin and clopidogrel x 12 months. 2. Secondary prevention with statin therapy. 3. Check lipid panel and urine drug screen.  Nelva Bush, MD [facility] Pager: [phone]

## 2019-08-25 NOTE — Interval H&P Note (Signed)
History and Physical Interval Note:  08/25/2019 8:37 AM  Paul Chandler  has presented today for cardiac catheterization, with the diagnosis of NSTEMI.  The various methods of treatment have been discussed with the patient and family. After consideration of risks, benefits and other options for treatment, the patient has consented to  Procedure(s): LEFT HEART CATH AND CORONARY ANGIOGRAPHY (N/A) as a surgical intervention.  The patient's history has been reviewed, patient examined, no change in status, stable for surgery.  I have reviewed the patient's chart and labs.  Questions were answered to the patient's satisfaction.    Cath Lab Visit (complete for each Cath Lab visit)  Clinical Evaluation Leading to the Procedure:   ACS: Yes.    Non-ACS:  N/A  Desha Bitner

## 2019-08-25 NOTE — Progress Notes (Signed)
Pharmacy note: tirofiban  28 yo male with CP s/p cath with LAD disease and possible non-occlusive thrombus.  Pharmacy consulted to dose tirofiban (continuing 18 hours post cath). -tirofiban started in cath  Platelets 310 - within normal limits and stable.  No bleeding noted.   Plan -Continue tirofiban 0.59mcg/kg/hr- stop time 4AM. -CBC in AM.   Sloan Leiter, PharmD, BCPS, BCCCP Clinical Pharmacist Clinical phone 08/25/2019 until 10:30P- #[phone] Please refer to [facility] for [place] numbers 08/25/2019, 5:56 PM

## 2019-08-26 ENCOUNTER — Encounter (HOSPITAL_COMMUNITY): Payer: Self-pay | Admitting: Internal Medicine

## 2019-08-26 DIAGNOSIS — I10 Essential (primary) hypertension: Secondary | ICD-10-CM

## 2019-08-26 DIAGNOSIS — E785 Hyperlipidemia, unspecified: Secondary | ICD-10-CM

## 2019-08-26 LAB — CBC
HCT: 48.3 % (ref 39.0–52.0)
Hemoglobin: 15.8 g/dL (ref 13.0–17.0)
MCH: 28.8 pg (ref 26.0–34.0)
MCHC: 32.7 g/dL (ref 30.0–36.0)
MCV: 88.1 fL (ref 80.0–100.0)
Platelets: 312 10*3/uL (ref 150–400)
RBC: 5.48 MIL/uL (ref 4.22–5.81)
RDW: 12.5 % (ref 11.5–15.5)
WBC: 5.5 10*3/uL (ref 4.0–10.5)
nRBC: 0 % (ref 0.0–0.2)

## 2019-08-26 LAB — HEPATIC FUNCTION PANEL
ALT: 34 U/L (ref 0–44)
AST: 24 U/L (ref 15–41)
Albumin: 4.3 g/dL (ref 3.5–5.0)
Alkaline Phosphatase: 52 U/L (ref 38–126)
Bilirubin, Direct: <0.1 mg/dL (ref 0.0–0.2)
Total Bilirubin: 1 mg/dL (ref 0.3–1.2)
Total Protein: 7.5 g/dL (ref 6.5–8.1)

## 2019-08-26 LAB — HEMOGLOBIN A1C
Hgb A1c MFr Bld: 5.7 % — ABNORMAL HIGH (ref 4.8–5.6)
Mean Plasma Glucose: 116.89 mg/dL

## 2019-08-26 MED ORDER — NITROGLYCERIN 0.4 MG SL SUBL: 0.4000 mg | SUBLINGUAL_TABLET | SUBLINGUAL | 12 refills | Status: DC | PRN

## 2019-08-26 MED ORDER — CLOPIDOGREL BISULFATE 75 MG PO TABS: 75.0000 mg | ORAL_TABLET | Freq: Every day | ORAL | 11 refills | Status: DC

## 2019-08-26 MED ORDER — METOPROLOL SUCCINATE ER 25 MG PO TB24: 25.0000 mg | ORAL_TABLET | Freq: Every day | ORAL | 11 refills | Status: DC

## 2019-08-26 MED ORDER — ASPIRIN 81 MG PO TBEC: 81.0000 mg | DELAYED_RELEASE_TABLET | Freq: Every day | ORAL | 11 refills | Status: DC

## 2019-08-26 MED ORDER — ATORVASTATIN CALCIUM 40 MG PO TABS: 40.0000 mg | ORAL_TABLET | Freq: Every day | ORAL | 11 refills | Status: DC

## 2019-08-26 MED FILL — CLOPIDOGREL 75 MG TABLET: 75 | 30 days supply | Qty: 30 | Fill #0

## 2019-08-26 MED FILL — ASPIRIN 81MG ADULT LOW STRE: 81 | 30 days supply | Qty: 30 | Fill #0

## 2019-08-26 MED FILL — ATORVASTATIN CALCIUM 40 MG: 40 | 30 days supply | Qty: 30 | Fill #0

## 2019-08-26 MED FILL — NITROGLYCERIN 0.4 MG TAB SL: 0.4 | 25 days supply | Qty: 25 | Fill #0

## 2019-08-26 MED FILL — METOPROLOL SUCCINATE ER 25: 25 | 30 days supply | Qty: 30 | Fill #0

## 2019-08-26 NOTE — Progress Notes (Addendum)
CARDIAC REHAB PHASE I   PRE:  Rate/Rhythm: 84 SR    BP: sitting 138/89    SaO2:   MODE:  Ambulation: 800 ft   POST:  Rate/Rhythm: 98 SR    BP: sitting 138/102     SaO2:   Tolerated well, no c/o with long distance however BP somewhat elevated. Discussed MI, Plavix, restrictions/recovery, diet, exercise, NTG, and CRPII. Pt very receptive and motivated. He had been running PTA. Encouraged him to start back with walking or stationary bike and eventually progress to running again. He is interested in [place] and will refer to [place]. He did have slight burning in left chest at the end of our discussion.   Pt is interested in participating in Virtual Cardiac and Pulmonary Rehab. Pt advised that Virtual Cardiac and Pulmonary Rehab is provided at no cost to the patient.  Checklist:  1. Pt has smart device  ie smartphone and/or ipad for downloading an app  Yes 2. Reliable internet/wifi service    Yes 3. Understands how to use their smartphone and navigate within an app.  Yes  Pt verbalized understanding and is in agreement.  [phone]  [place], ACSM 08/26/2019 10:10 AM

## 2019-08-26 NOTE — Discharge Summary (Signed)
Discharge Summary    Patient ID: Paul Chandler MRN: [id number]; DOB: [birth year]  Admit date: 08/22/2019 Discharge date: 08/26/2019  Primary Care Provider: Patient, No Pcp Per  Primary Cardiologist: Mertie Moores, MD  Primary Electrophysiologist:  None   Discharge Diagnoses    Principal Problem:   Non-STEMI (non-ST elevated myocardial infarction) [facility]) Active Problems:   Hyperlipidemia with target low density lipoprotein (LDL) cholesterol less than 70 mg/dL   Allergies No Known Allergies  Diagnostic Studies/Procedures    Echo 08/23/19: IMPRESSIONS  1. Left ventricular ejection fraction, by visual estimation, is 60 to 65%. The left ventricle has normal function. There is no left ventricular hypertrophy. 2. Global right ventricle has normal systolic function.The right ventricular size is normal. No increase in right ventricular wall thickness. 3. Left atrial size was normal. 4. Right atrial size was normal. 5. The mitral valve is normal in structure. Trace mitral valve regurgitation. 6. The tricuspid valve is normal in structure. Tricuspid valve regurgitation is not demonstrated. 7. The aortic valve is normal in structure. Aortic valve regurgitation is not visualized. 8. The pulmonic valve was normal in structure. Pulmonic valve regurgitation is not visualized. 9. The atrial septum is grossly normal.  LHC 08/25/19: FINDINGS: 1. Hazy filling defect in the proximal LAD with <50% luminal narrowing suspicious for non-occlusive thrombus. Otherwise, no angiographically significant CAD. 2. Normal left ventricular filling pressure.  RECOMMENDATIONS: 1. Images reviewed with interventional team; we will manage medically with antiplatelet therapy and risk factor modification: continue tirofiban infusion x 18 hours and DAPT with aspirin and clopidogrel x 12 months. 2. Secondary prevention with statin therapy. 3. Check lipid panel and urine drug screen. _____________    History of Present Illness     Paul Chandler is a 28 y.o. male with a history of hyperlipidemia, but no other cardiac risk factors.  He was admitted 08/23/2019 with chest pain, a non-STEMI.  Hospital Course     Consultants: None  Cardiac enzymes were elevated indicating a non-STEMI.  He was treated medically and remained pain-free.  He was taken to the Cath Lab on 08/25/2019.  Cardiac catheterization results are above.  It was suspected that he had an LAD occlusion that was resolving.  He was continued on tirofiban for 18 hours.  He was started on aspirin and Plavix.  He is to be on DAPT for 12 months.  A lipid profile is below.  He has significantly elevated total cholesterol and LDL, likely familial hyperlipidemia.  He was started on high-dose statin at Lipitor 40 mg a day.  He is to follow-up in 6-8 weeks and get a repeat lipid profile at that time.  He was seen by cardiac rehab and counseled on heart healthy lifestyle modifications and exercise guidelines.  He is encouraged to follow-up with them as an outpatient.  He admitted to having financial concerns because his income had been greatly decreased by Covid.  Financial counselors to speak with him.  On 11/3 he was seen by Dr. Oval Linsey, and all data were reviewed.  He was ambulating well, no chest pain or shortness of breath.  No further inpatient work-up is indicated and he is considered stable for discharge, to follow-up as an outpatient.   Did the patient have an acute coronary syndrome (MI, NSTEMI, STEMI, etc) this admission?:  Yes                               AHA/ACC  Clinical Performance & Quality Measures: 1. Aspirin prescribed? - Yes 2. ADP Receptor Inhibitor (Plavix/Clopidogrel, Brilinta/Ticagrelor or Effient/Prasugrel) prescribed (includes medically managed patients)? - Yes 3. Beta Blocker prescribed? - Yes 4. High Intensity Statin (Lipitor 40-80mg  or Crestor 20-40mg ) prescribed? - Yes 5. EF assessed during THIS  hospitalization? - Yes 6. For EF <40%, was ACEI/ARB prescribed? - Not Applicable (EF >/= 40%) 7. For EF <40%, Aldosterone Antagonist (Spironolactone or Eplerenone) prescribed? - Not Applicable (EF >/= 40%) 8. Cardiac Rehab Phase II ordered (Included Medically managed Patients)? - Yes   _____________  Discharge Vitals Blood pressure 124/81, pulse 81, temperature 98.4 F (36.9 C), temperature source Oral, resp. rate 18, height 5\' 6"  (1.676 m), weight 78.8 kg, SpO2 99 %.  Filed Weights   08/24/19 0509 08/25/19 0446 08/26/19 0540  Weight: 77.8 kg 76.9 kg 78.8 kg    Labs & Radiologic Studies    CBC Recent Labs    08/25/19 1641 08/26/19 0321  WBC 5.9 5.5  HGB 16.3 15.8  HCT 49.1 48.3  MCV 87.7 88.1  PLT 310 312   Basic Metabolic Panel Recent Labs    16/07/9610/02/20 0538  NA 138  K 4.3  CL 103  CO2 25  GLUCOSE 107*  BUN 16  CREATININE 1.18  CALCIUM 10.1   Liver Function Tests Recent Labs    08/26/19 0321  AST 24  ALT 34  ALKPHOS 52  BILITOT 1.0  PROT 7.5  ALBUMIN 4.3   No results for input(s): LIPASE, AMYLASE in the last 72 hours. High Sensitivity Troponin:   Recent Labs  Lab 08/22/19 1803 08/22/19 2042 08/23/19 0406 08/24/19 1336 08/24/19 1845  TROPONINIHS 3 46* 6,186* 4,107* 4,215*     Hemoglobin A1C Recent Labs    08/26/19 0321  HGBA1C 5.7*   Fasting Lipid Panel Recent Labs    08/25/19 0538  CHOL 312*  HDL 46  LDLCALC 234*  TRIG 161*  CHOLHDL 6.8   Lab Results  Component Value Date   HGBA1C 5.7 (H) 08/26/2019   _____________  Dg Chest 2 View  Result Date: 08/22/2019 CLINICAL DATA:  Chest pain EXAM: CHEST - 2 VIEW COMPARISON:  07/01/2015 FINDINGS: The heart size and mediastinal contours are within normal limits. Both lungs are clear. The visualized skeletal structures are unremarkable. IMPRESSION: No active cardiopulmonary disease. Electronically Signed   By: Jasmine PangKim  Fujinaga M.D.   On: 08/22/2019 18:30   Disposition   Pt is being  discharged home today in good condition.  Follow-up Plans & Appointments    Follow-up Information    [facility] Follow up on 09/26/2019.   Why: 9:30 AM Contact information: [address] [place] [id number] [phone]       Manson PasseyBhagat, Bhavinkumar, [place] Follow up on 09/09/2019.   Specialty: Cardiology Contact information: [address] [place] [address] [phone]          Discharge Instructions    Amb Referral to Cardiac Rehabilitation   Complete by: As directed    Diagnosis: NSTEMI   After initial evaluation and assessments completed: Virtual Based Care may be provided alone or in conjunction with Phase 2 Cardiac Rehab based on patient barriers.: Yes   Diet - low sodium heart healthy   Complete by: As directed    Increase activity slowly   Complete by: As directed       Discharge Medications   Allergies as of 08/26/2019   No Known Allergies  Medication List    TAKE these medications   aspirin 81 MG EC tablet Take 1 tablet (81 mg total) by mouth daily. Start taking on: August 27, 2019   atorvastatin 40 MG tablet Commonly known as: LIPITOR Take 1 tablet (40 mg total) by mouth daily at 6 PM.   clopidogrel 75 MG tablet Commonly known as: PLAVIX Take 1 tablet (75 mg total) by mouth daily with breakfast. Start taking on: August 27, 2019   metoprolol succinate 25 MG 24 hr tablet Commonly known as: Toprol XL Take 1 tablet (25 mg total) by mouth daily. Take with or immediately following a meal.   nitroGLYCERIN 0.4 MG SL tablet Commonly known as: NITROSTAT Place 1 tablet (0.4 mg total) under the tongue every 5 (five) minutes as needed for chest pain.          Outstanding Labs/Studies   None  Duration of Discharge Encounter   Greater than 30 minutes including physician time.  Signed, Theodore Demark, PA-C 08/26/2019, 12:14 PM

## 2019-08-26 NOTE — Care Management (Signed)
1241 08-26-19 MATCH completed for medications. Medications to be delivered to the room via [facility]. No further needs from CM at this time. Bethena Roys , RN, BSN Case Manager [phone]

## 2019-08-26 NOTE — Progress Notes (Addendum)
Progress Note  Patient Name: Paul Chandler Date of Encounter: 08/26/2019  Primary Cardiologist: Kristeen MissPhilip Nahser, MD, New   Subjective   No chest pain or SOB overnight. 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 Smokes weed, no excessive caffeine, no ETOH, no other drugs. Exercises as an outpt.   Inpatient Medications    Scheduled Meds: . aspirin EC  81 mg Oral Daily  . atorvastatin  40 mg Oral q1800  . clopidogrel  75 mg Oral Q breakfast  . sodium chloride flush  3 mL Intravenous Q12H   Continuous Infusions: . sodium chloride     PRN Meds: sodium chloride, acetaminophen, HYDROcodone-acetaminophen, nitroGLYCERIN, ondansetron (ZOFRAN) IV, sodium chloride flush   Vital Signs    Vitals:   08/25/19 1514 08/25/19 2033 08/26/19 0047 08/26/19 0540  BP:  125/90 127/85 132/87  Pulse:  66 79 81  Resp: 20  16 20   Temp: 98.6 F (37 C) 98.5 F (36.9 C) (!) 97.5 F (36.4 C) (!) 97.4 F (36.3 C)  TempSrc: Oral Oral Oral Oral  SpO2:  99% 99% 99%  Weight:    78.8 kg  Height:        Intake/Output Summary (Last 24 hours) at 08/26/2019 0751 Last data filed at 08/25/2019 1500 Gross per 24 hour  Intake 1742.31 ml  Output -  Net 1742.31 ml   Last 3 Weights 08/26/2019 08/25/2019 08/24/2019  Weight (lbs) 173 lb 11.6 oz 169 lb 8 oz 171 lb 8 oz  Weight (kg) 78.8 kg 76.885 kg 77.792 kg  Some encounter information is confidential and restricted. Go to Review Flowsheets activity to see all data.      Telemetry    SR, HR low 60s at times overnight - Personally Reviewed  ECG    None today - Personally Reviewed  Physical Exam   VS:  BP 132/87 (BP Location: Left Arm)   Pulse 81   Temp (!) 97.4 F (36.3 C) (Oral)   Resp 20   Ht 5\' 6"  (1.676 m)   Wt 78.8 kg   SpO2 99%   BMI 28.04 kg/m  , BMI Body mass index is 28.04 kg/m.  Vitals with BMI 08/26/2019 08/26/2019 08/25/2019  Height - - -  Weight 173 lbs 12 oz - -  BMI 28.05 - -  Systolic 132 127 [id number]  Diastolic 87 85 90  Pulse 81  79 66  Some encounter information is confidential and restricted. Go to Review Flowsheets activity to see all data.   General: Well developed, well nourished, male in no acute distress Head: Eyes PERRLA, Head normocephalic and atraumatic Lungs: almost clear bilaterally to auscultation. Minimal wheeze R side Heart: HRRR S1 S2, without rub or gallop. No murmur. 4/4 extremity pulses are 2+ & equal. No JVD. Abdomen: Bowel sounds are present, abdomen soft and non-tender without masses or  hernias noted. Msk: Normal strength and tone for age. Extremities: No clubbing, cyanosis or edema. R radial cath site w/out ecchymosis or hematoma Skin:  No rashes or lesions noted. Neuro: Alert and oriented X 3. Psych:  Good affect, responds appropriately  Labs    High Sensitivity Troponin:   Recent Labs  Lab 08/22/19 1803 08/22/19 2042 08/23/19 0406 08/24/19 1336 08/24/19 1845  TROPONINIHS 3 46* 6,186* 4,107* 4,215*      Chemistry Recent Labs  Lab 08/22/19 1803 08/23/19 0406 08/25/19 0538 08/26/19 0321  NA 136 138 138  --   K 3.6 4.1 4.3  --  CL 102 102 103  --   CO2 23 24 25   --   GLUCOSE 128* 102* 107*  --   BUN 16 15 16   --   CREATININE 1.27* 1.21 1.18  --   CALCIUM 10.2 9.9 10.1  --   PROT  --   --   --  7.5  ALBUMIN  --   --   --  4.3  AST  --   --   --  24  ALT  --   --   --  34  ALKPHOS  --   --   --  52  BILITOT  --   --   --  1.0  GFRNONAA >60 >60 >60  --   GFRAA >60 >60 >60  --   ANIONGAP 11 12 10   --      Hematology Recent Labs  Lab 08/25/19 0442 08/25/19 1641 08/26/19 0321  WBC 6.1 5.9 5.5  RBC 5.60 5.60 5.48  HGB 16.1 16.3 15.8  HCT 48.8 49.1 48.3  MCV 87.1 87.7 88.1  MCH 28.8 29.1 28.8  MCHC 33.0 33.2 32.7  RDW 12.6 12.5 12.5  PLT 311 310 312   DDimer  Recent Labs  Lab 08/22/19 1803  DDIMER <0.27    Lab Results  Component Value Date   CHOL 312 (H) 08/25/2019   HDL 46 08/25/2019   LDLCALC 234 (H) 08/25/2019   TRIG 161 (H) 08/25/2019    CHOLHDL 6.8 08/25/2019    Radiology    No results found.  Cardiac Studies   Echo 08/23/19: IMPRESSIONS   1. Left ventricular ejection fraction, by visual estimation, is 60 to 65%. The left ventricle has normal function. There is no left ventricular hypertrophy.  2. Global right ventricle has normal systolic function.The right ventricular size is normal. No increase in right ventricular wall thickness.  3. Left atrial size was normal.  4. Right atrial size was normal.  5. The mitral valve is normal in structure. Trace mitral valve regurgitation.  6. The tricuspid valve is normal in structure. Tricuspid valve regurgitation is not demonstrated.  7. The aortic valve is normal in structure. Aortic valve regurgitation is not visualized.  8. The pulmonic valve was normal in structure. Pulmonic valve regurgitation is not visualized.  9. The atrial septum is grossly normal.  LHC 08/25/19: FINDINGS: 1. Hazy filling defect in the proximal LAD with <50% luminal narrowing suspicious for non-occlusive thrombus.  Otherwise, no angiographically significant CAD. 2. Normal left ventricular filling pressure.  RECOMMENDATIONS: 1. Images reviewed with interventional team; we will manage medically with antiplatelet therapy and risk factor modification: continue tirofiban infusion x 18 hours and DAPT with aspirin and clopidogrel x 12 months. 2. Secondary prevention with statin therapy. 3. Check lipid panel and urine drug screen.  Patient Profile     28 y.o. male with hyperlipidemia admitted with NSTEMI.  Assessment & Plan    # NSTEMI:  - on ASA, Plavix - completed Tirofiban - continue high-dose statin>>MD advise on increasing Lipitor to 80 mg qd - MD advise on adding Toprol XL 25 mg qd  # Hyperlipidemia:  - likely familial, lipid profile above - on Lipitor, recheck 6-8 weeks - goal LDL < 70  # Elevated Blood pressure:  - BP borderline - consider low-dose BB  Plan: Financial counselors  and cardiac rehab to see (called), MD advise on d/c  For questions or updates, please contact [place] Please consult [email or web address] for contact info under  Signed, Theodore Demark, PA-C  08/26/2019, 7:51 AM

## 2019-08-26 NOTE — Plan of Care (Signed)
  Problem: Clinical Measurements: Goal: Will remain free from infection Outcome: Completed/Met Goal: Diagnostic test results will improve Outcome: Completed/Met Goal: Cardiovascular complication will be avoided Outcome: Completed/Met   Problem: Pain Managment: Goal: General experience of comfort will improve Outcome: Completed/Met   Problem: Activity: Goal: Ability to tolerate increased activity will improve Outcome: Completed/Met   Problem: Cardiac: Goal: Ability to achieve and maintain adequate cardiovascular perfusion will improve Outcome: Completed/Met   Problem: Education: Goal: Understanding of CV disease, CV risk reduction, and recovery process will improve Outcome: Completed/Met Goal: Individualized Educational Video(s) Outcome: Completed/Met   Problem: Activity: Goal: Ability to return to baseline activity level will improve Outcome: Completed/Met   Problem: Cardiovascular: Goal: Ability to achieve and maintain adequate cardiovascular perfusion will improve Outcome: Completed/Met Goal: Vascular access site(s) Level 0-1 will be maintained Outcome: Completed/Met

## 2019-08-26 NOTE — Clinical Social Work Note (Signed)
CSW made pt a follow up appointment with [facility]. Pt is aware and appreciative. Pt also emailed financial counselor to follow up with pt regarding no insurance--pt agreeable and confirmed his cell phone number was the best to reach him. RN notified pt is cleared from [facility] standpoint.  [place], [place]

## 2019-08-26 NOTE — Plan of Care (Signed)
  Problem: Clinical Measurements: Goal: Will remain free from infection Outcome: Completed/Met Goal: Diagnostic test results will improve Outcome: Completed/Met Goal: Cardiovascular complication will be avoided Outcome: Completed/Met   Problem: Pain Managment: Goal: General experience of comfort will improve Outcome: Completed/Met   Problem: Activity: Goal: Ability to tolerate increased activity will improve Outcome: Completed/Met   Problem: Cardiac: Goal: Ability to achieve and maintain adequate cardiovascular perfusion will improve Outcome: Completed/Met   Problem: Education: Goal: Understanding of CV disease, CV risk reduction, and recovery process will improve Outcome: Completed/Met Goal: Individualized Educational Video(s) Outcome: Completed/Met   Problem: Activity: Goal: Ability to return to baseline activity level will improve Outcome: Completed/Met   Problem: Cardiovascular: Goal: Ability to achieve and maintain adequate cardiovascular perfusion will improve Outcome: Completed/Met Goal: Vascular access site(s) Level 0-1 will be maintained Outcome: Completed/Met   Problem: Health Behavior/Discharge Planning: Goal: Ability to safely manage health-related needs after discharge will improve Outcome: Completed/Met

## 2019-09-09 ENCOUNTER — Ambulatory Visit: Payer: Self-pay | Admitting: Physician Assistant

## 2019-09-26 ENCOUNTER — Encounter: Payer: Self-pay | Admitting: Family Medicine

## 2019-09-26 ENCOUNTER — Other Ambulatory Visit: Payer: Self-pay

## 2019-09-26 ENCOUNTER — Ambulatory Visit: Payer: Self-pay | Attending: Family Medicine | Admitting: Family Medicine

## 2019-09-26 DIAGNOSIS — R7303 Prediabetes: Secondary | ICD-10-CM

## 2019-09-26 DIAGNOSIS — E785 Hyperlipidemia, unspecified: Secondary | ICD-10-CM

## 2019-09-26 DIAGNOSIS — I214 Non-ST elevation (NSTEMI) myocardial infarction: Secondary | ICD-10-CM

## 2019-09-26 MED ORDER — NITROGLYCERIN 0.4 MG SL SUBL: 0.4000 mg | SUBLINGUAL_TABLET | SUBLINGUAL | 12 refills | Status: DC | PRN

## 2019-09-26 MED ORDER — CLOPIDOGREL BISULFATE 75 MG PO TABS: 75.0000 mg | ORAL_TABLET | Freq: Every day | ORAL | 3 refills | Status: DC

## 2019-09-26 MED ORDER — METOPROLOL SUCCINATE ER 25 MG PO TB24: 25.0000 mg | ORAL_TABLET | Freq: Every day | ORAL | 1 refills | Status: DC

## 2019-09-26 MED ORDER — ASPIRIN 81 MG PO TBEC: 81.0000 mg | DELAYED_RELEASE_TABLET | Freq: Every day | ORAL | 3 refills | Status: DC

## 2019-09-26 MED ORDER — ATORVASTATIN CALCIUM 40 MG PO TABS: ORAL_TABLET | ORAL | 1 refills | Status: DC

## 2019-09-26 NOTE — Progress Notes (Signed)
Patient verified DOB Patient has eaten today. Patient has taken medication today. Patient denies pain at this time. 
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
## 2019-09-30 ENCOUNTER — Ambulatory Visit: Payer: Self-pay | Admitting: Pharmacist
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
## 2019-10-08 ENCOUNTER — Ambulatory Visit: Payer: Self-pay | Admitting: Cardiology

## 2019-10-27 ENCOUNTER — Ambulatory Visit: Payer: Self-pay | Admitting: Cardiology
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
## 2020-02-05 ENCOUNTER — Inpatient Hospital Stay (HOSPITAL_COMMUNITY)
Admission: EM | Admit: 2020-02-05 | Discharge: 2020-02-08 | DRG: 247 | Disposition: A | Discharge status: Home / Self Care | Payer: Self-pay | Attending: Cardiovascular Disease | Admitting: Cardiovascular Disease

## 2020-02-05 ENCOUNTER — Encounter (HOSPITAL_COMMUNITY): Payer: Self-pay

## 2020-02-05 ENCOUNTER — Other Ambulatory Visit: Payer: Self-pay

## 2020-02-05 ENCOUNTER — Encounter (HOSPITAL_COMMUNITY)
Admission: EM | Disposition: A | Discharge status: Home / Self Care | Payer: Self-pay | Source: Home / Self Care | Attending: Cardiovascular Disease

## 2020-02-05 ENCOUNTER — Encounter (HOSPITAL_COMMUNITY): Payer: Medicaid Other

## 2020-02-05 DIAGNOSIS — R7303 Prediabetes: Secondary | ICD-10-CM | POA: Diagnosis present

## 2020-02-05 DIAGNOSIS — F1290 Cannabis use, unspecified, uncomplicated: Secondary | ICD-10-CM | POA: Diagnosis present

## 2020-02-05 DIAGNOSIS — I214 Non-ST elevation (NSTEMI) myocardial infarction: Secondary | ICD-10-CM

## 2020-02-05 DIAGNOSIS — Z79899 Other long term (current) drug therapy: Secondary | ICD-10-CM

## 2020-02-05 DIAGNOSIS — E785 Hyperlipidemia, unspecified: Secondary | ICD-10-CM

## 2020-02-05 DIAGNOSIS — Z7982 Long term (current) use of aspirin: Secondary | ICD-10-CM

## 2020-02-05 DIAGNOSIS — I213 ST elevation (STEMI) myocardial infarction of unspecified site: Secondary | ICD-10-CM

## 2020-02-05 DIAGNOSIS — Z955 Presence of coronary angioplasty implant and graft: Secondary | ICD-10-CM

## 2020-02-05 DIAGNOSIS — Z7902 Long term (current) use of antithrombotics/antiplatelets: Secondary | ICD-10-CM

## 2020-02-05 DIAGNOSIS — I2510 Atherosclerotic heart disease of native coronary artery without angina pectoris: Secondary | ICD-10-CM | POA: Diagnosis present

## 2020-02-05 DIAGNOSIS — F1210 Cannabis abuse, uncomplicated: Secondary | ICD-10-CM

## 2020-02-05 DIAGNOSIS — I255 Ischemic cardiomyopathy: Secondary | ICD-10-CM | POA: Diagnosis present

## 2020-02-05 DIAGNOSIS — Z20822 Contact with and (suspected) exposure to covid-19: Secondary | ICD-10-CM | POA: Diagnosis present

## 2020-02-05 DIAGNOSIS — Z8249 Family history of ischemic heart disease and other diseases of the circulatory system: Secondary | ICD-10-CM

## 2020-02-05 DIAGNOSIS — E782 Mixed hyperlipidemia: Secondary | ICD-10-CM

## 2020-02-05 DIAGNOSIS — I2102 ST elevation (STEMI) myocardial infarction involving left anterior descending coronary artery: Principal | ICD-10-CM | POA: Diagnosis present

## 2020-02-05 HISTORY — DX: Atherosclerotic heart disease of native coronary artery without angina pectoris: I25.10

## 2020-02-05 HISTORY — PX: CORONARY STENT INTERVENTION: CATH118234

## 2020-02-05 HISTORY — DX: Abnormal findings on diagnostic imaging of heart and coronary circulation: R93.1

## 2020-02-05 HISTORY — DX: Prediabetes: R73.03

## 2020-02-05 HISTORY — PX: CORONARY/GRAFT ACUTE MI REVASCULARIZATION: CATH118305

## 2020-02-05 HISTORY — DX: Cannabis use, unspecified, uncomplicated: F12.90

## 2020-02-05 HISTORY — DX: Ischemic cardiomyopathy: I25.5

## 2020-02-05 HISTORY — PX: LEFT HEART CATH AND CORONARY ANGIOGRAPHY: CATH118249

## 2020-02-05 LAB — COMPREHENSIVE METABOLIC PANEL
ALT: 64 U/L — ABNORMAL HIGH (ref 0–44)
AST: 37 U/L (ref 15–41)
Albumin: 4.6 g/dL (ref 3.5–5.0)
Alkaline Phosphatase: 67 U/L (ref 38–126)
Anion gap: 14 (ref 5–15)
BUN: 15 mg/dL (ref 6–20)
CO2: 22 mmol/L (ref 22–32)
Calcium: 9.7 mg/dL (ref 8.9–10.3)
Chloride: 103 mmol/L (ref 98–111)
Creatinine, Ser: 1.19 mg/dL (ref 0.61–1.24)
GFR calc Af Amer: >60 mL/min (ref >60)
GFR calc non Af Amer: >60 mL/min (ref >60)
Glucose, Bld: 135 mg/dL — ABNORMAL HIGH (ref 70–99)
Potassium: 4.3 mmol/L (ref 3.5–5.1)
Sodium: 139 mmol/L (ref 135–145)
Total Bilirubin: 1.2 mg/dL (ref 0.3–1.2)
Total Protein: 7.4 g/dL (ref 6.5–8.1)

## 2020-02-05 LAB — CBC WITH DIFFERENTIAL/PLATELET
Abs Immature Granulocytes: 0.04 10*3/uL (ref 0.00–0.07)
Basophils Absolute: 0 10*3/uL (ref 0.0–0.1)
Basophils Relative: 0 %
Eosinophils Absolute: 0 10*3/uL (ref 0.0–0.5)
Eosinophils Relative: 0 %
HCT: 46.4 % (ref 39.0–52.0)
Hemoglobin: 14.6 g/dL (ref 13.0–17.0)
Immature Granulocytes: 1 %
Lymphocytes Relative: 28 %
Lymphs Abs: 2.1 10*3/uL (ref 0.7–4.0)
MCH: 28.1 pg (ref 26.0–34.0)
MCHC: 31.5 g/dL (ref 30.0–36.0)
MCV: 89.2 fL (ref 80.0–100.0)
Monocytes Absolute: 0.7 10*3/uL (ref 0.1–1.0)
Monocytes Relative: 10 %
Neutro Abs: 4.5 10*3/uL (ref 1.7–7.7)
Neutrophils Relative %: 61 %
Platelets: 330 10*3/uL (ref 150–400)
RBC: 5.2 MIL/uL (ref 4.22–5.81)
RDW: 12.8 % (ref 11.5–15.5)
WBC: 7.4 10*3/uL (ref 4.0–10.5)
nRBC: 0 % (ref 0.0–0.2)

## 2020-02-05 LAB — HEMOGLOBIN A1C
Hgb A1c MFr Bld: 5.8 % — ABNORMAL HIGH (ref 4.8–5.6)
Mean Plasma Glucose: 119.76 mg/dL

## 2020-02-05 LAB — POCT I-STAT, CHEM 8
BUN: 15 mg/dL (ref 6–20)
Calcium, Ion: 1.24 mmol/L (ref 1.15–1.40)
Chloride: 103 mmol/L (ref 98–111)
Creatinine, Ser: 1 mg/dL (ref 0.61–1.24)
Glucose, Bld: 146 mg/dL — ABNORMAL HIGH (ref 70–99)
HCT: 43 % (ref 39.0–52.0)
Hemoglobin: 14.6 g/dL (ref 13.0–17.0)
Potassium: 3.7 mmol/L (ref 3.5–5.1)
Sodium: 139 mmol/L (ref 135–145)
TCO2: 24 mmol/L (ref 22–32)

## 2020-02-05 LAB — LIPID PANEL
Cholesterol: 308 mg/dL — ABNORMAL HIGH (ref 0–200)
HDL: 43 mg/dL (ref >40)
LDL Cholesterol: 227 mg/dL — ABNORMAL HIGH (ref 0–99)
Total CHOL/HDL Ratio: 7.2 RATIO
Triglycerides: 188 mg/dL — ABNORMAL HIGH (ref <150)
VLDL: 38 mg/dL (ref 0–40)

## 2020-02-05 LAB — POCT ACTIVATED CLOTTING TIME
Activated Clotting Time: 219 seconds
Activated Clotting Time: 274 seconds

## 2020-02-05 LAB — TROPONIN I (HIGH SENSITIVITY)
Troponin I (High Sensitivity): 6 ng/L (ref <18)
Troponin I (High Sensitivity): 2319 ng/L (ref <18)
Troponin I (High Sensitivity): 4612 ng/L (ref <18)
Troponin I (High Sensitivity): 8227 ng/L (ref <18)

## 2020-02-05 LAB — PROTIME-INR
INR: 0.9 (ref 0.8–1.2)
Prothrombin Time: 12.1 seconds (ref 11.4–15.2)

## 2020-02-05 LAB — APTT: aPTT: 23 seconds — ABNORMAL LOW (ref 24–36)

## 2020-02-05 LAB — SEDIMENTATION RATE: Sed Rate: 3 mm/hr (ref 0–16)

## 2020-02-05 LAB — RESPIRATORY PANEL BY RT PCR (FLU A&B, COVID)
Influenza A by PCR: NEGATIVE
Influenza B by PCR: NEGATIVE
SARS Coronavirus 2 by RT PCR: NEGATIVE

## 2020-02-05 LAB — ANTITHROMBIN III: AntiThromb III Func: 134 % — ABNORMAL HIGH (ref 75–120)

## 2020-02-05 SURGERY — CORONARY/GRAFT ACUTE MI REVASCULARIZATION
Anesthesia: LOCAL

## 2020-02-05 MED ORDER — NITROGLYCERIN IN D5W 200-5 MCG/ML-% IV SOLN: 0.0000 ug/min | INTRAVENOUS | Status: DC

## 2020-02-05 MED ORDER — TICAGRELOR 90 MG PO TABS
ORAL_TABLET | ORAL | Status: AC
  Filled 2020-02-05: qty 2

## 2020-02-05 MED ORDER — TIROFIBAN HCL IN NACL 5-0.9 MG/100ML-% IV SOLN
0.1500 ug/kg/min | INTRAVENOUS | Status: AC
  Administered 2020-02-05 – 2020-02-06 (×3): 0.15 ug/kg/min via INTRAVENOUS
  Filled 2020-02-05 (×3): qty 100

## 2020-02-05 MED ORDER — TICAGRELOR 90 MG PO TABS
90.0000 mg | ORAL_TABLET | Freq: Two times a day (BID) | ORAL | Status: DC
  Administered 2020-02-05 – 2020-02-08 (×6): 90 mg via ORAL
  Filled 2020-02-05 (×7): qty 1

## 2020-02-05 MED ORDER — IOHEXOL 350 MG/ML SOLN
INTRAVENOUS | Status: DC | PRN
  Administered 2020-02-05: 15:00:00 175 mL via INTRA_ARTERIAL

## 2020-02-05 MED ORDER — NITROGLYCERIN IN D5W 200-5 MCG/ML-% IV SOLN
INTRAVENOUS | Status: AC | PRN
  Administered 2020-02-05: 10 ug/min via INTRAVENOUS

## 2020-02-05 MED ORDER — NITROGLYCERIN 0.4 MG SL SUBL: 0.4000 mg | SUBLINGUAL_TABLET | SUBLINGUAL | Status: DC | PRN

## 2020-02-05 MED ORDER — HEPARIN SODIUM (PORCINE) 1000 UNIT/ML IJ SOLN
INTRAMUSCULAR | Status: AC
  Filled 2020-02-05: qty 1

## 2020-02-05 MED ORDER — SODIUM CHLORIDE 0.9 % IV SOLN: INTRAVENOUS | Status: DC

## 2020-02-05 MED ORDER — IOHEXOL 350 MG/ML SOLN
INTRAVENOUS | Status: AC
  Filled 2020-02-05: qty 1

## 2020-02-05 MED ORDER — FENTANYL CITRATE (PF) 100 MCG/2ML IJ SOLN
INTRAMUSCULAR | Status: DC | PRN
  Administered 2020-02-05: 50 ug via INTRAVENOUS

## 2020-02-05 MED ORDER — ACETAMINOPHEN 325 MG PO TABS: 650.0000 mg | ORAL_TABLET | ORAL | Status: DC | PRN

## 2020-02-05 MED ORDER — SODIUM CHLORIDE 0.9% FLUSH: 3.0000 mL | INTRAVENOUS | Status: DC | PRN

## 2020-02-05 MED ORDER — MORPHINE SULFATE (PF) 4 MG/ML IV SOLN
INTRAVENOUS | Status: DC | PRN
  Administered 2020-02-05: 2 mg via INTRAVENOUS

## 2020-02-05 MED ORDER — ASPIRIN EC 81 MG PO TBEC
81.0000 mg | DELAYED_RELEASE_TABLET | Freq: Every day | ORAL | Status: DC
  Administered 2020-02-06 – 2020-02-08 (×3): 81 mg via ORAL
  Filled 2020-02-05 (×4): qty 1

## 2020-02-05 MED ORDER — OXYCODONE-ACETAMINOPHEN 5-325 MG PO TABS
1.0000 | ORAL_TABLET | ORAL | Status: DC | PRN
  Administered 2020-02-05: 1 via ORAL
  Filled 2020-02-05: qty 1

## 2020-02-05 MED ORDER — LIDOCAINE HCL (PF) 1 % IJ SOLN
INTRAMUSCULAR | Status: AC
  Filled 2020-02-05: qty 30

## 2020-02-05 MED ORDER — HEPARIN (PORCINE) IN NACL 1000-0.9 UT/500ML-% IV SOLN
INTRAVENOUS | Status: DC | PRN
  Administered 2020-02-05 (×3): 500 mL

## 2020-02-05 MED ORDER — TICAGRELOR 90 MG PO TABS
ORAL_TABLET | ORAL | Status: DC | PRN
  Administered 2020-02-05: 180 mg via ORAL

## 2020-02-05 MED ORDER — TIROFIBAN HCL IN NACL 5-0.9 MG/100ML-% IV SOLN
INTRAVENOUS | Status: AC | PRN
  Administered 2020-02-05: 0.15 ug/kg/min via INTRAVENOUS

## 2020-02-05 MED ORDER — HEPARIN SODIUM (PORCINE) 1000 UNIT/ML IJ SOLN
INTRAMUSCULAR | Status: DC | PRN
  Administered 2020-02-05: 3000 [IU] via INTRAVENOUS
  Administered 2020-02-05: 5000 [IU] via INTRAVENOUS
  Administered 2020-02-05: 3000 [IU] via INTRAVENOUS

## 2020-02-05 MED ORDER — HEPARIN SODIUM (PORCINE) 5000 UNIT/ML IJ SOLN
INTRAMUSCULAR | Status: AC
  Administered 2020-02-05: 4000 [IU]
  Filled 2020-02-05: qty 1

## 2020-02-05 MED ORDER — NITROGLYCERIN IN D5W 200-5 MCG/ML-% IV SOLN
INTRAVENOUS | Status: AC
  Filled 2020-02-05: qty 250

## 2020-02-05 MED ORDER — SODIUM CHLORIDE 0.9 % IV SOLN: INTRAVENOUS | Status: AC

## 2020-02-05 MED ORDER — VERAPAMIL HCL 2.5 MG/ML IV SOLN
INTRAVENOUS | Status: DC | PRN
  Administered 2020-02-05: 8 mL via INTRA_ARTERIAL

## 2020-02-05 MED ORDER — HEPARIN (PORCINE) IN NACL 1000-0.9 UT/500ML-% IV SOLN
INTRAVENOUS | Status: AC
  Filled 2020-02-05: qty 1000

## 2020-02-05 MED ORDER — LABETALOL HCL 5 MG/ML IV SOLN: 10.0000 mg | INTRAVENOUS | Status: AC | PRN

## 2020-02-05 MED ORDER — SODIUM CHLORIDE 0.9 % IV SOLN: 250.0000 mL | INTRAVENOUS | Status: DC | PRN

## 2020-02-05 MED ORDER — HEPARIN BOLUS VIA INFUSION
4000.0000 [IU] | Freq: Once | INTRAVENOUS | Status: DC
  Filled 2020-02-05: qty 4000

## 2020-02-05 MED ORDER — METOPROLOL SUCCINATE ER 25 MG PO TB24
25.0000 mg | ORAL_TABLET | Freq: Every day | ORAL | Status: DC
  Administered 2020-02-05 – 2020-02-08 (×4): 25 mg via ORAL
  Filled 2020-02-05 (×4): qty 1

## 2020-02-05 MED ORDER — LIDOCAINE HCL (PF) 1 % IJ SOLN
INTRAMUSCULAR | Status: DC | PRN
  Administered 2020-02-05: 2 mL

## 2020-02-05 MED ORDER — VERAPAMIL HCL 2.5 MG/ML IV SOLN
INTRAVENOUS | Status: AC
  Filled 2020-02-05: qty 2

## 2020-02-05 MED ORDER — SODIUM CHLORIDE 0.9% FLUSH
3.0000 mL | Freq: Two times a day (BID) | INTRAVENOUS | Status: DC
  Administered 2020-02-05 – 2020-02-08 (×4): 3 mL via INTRAVENOUS

## 2020-02-05 MED ORDER — MORPHINE SULFATE (PF) 2 MG/ML IV SOLN: 2.0000 mg | INTRAVENOUS | Status: DC | PRN

## 2020-02-05 MED ORDER — ONDANSETRON HCL 4 MG/2ML IJ SOLN
4.0000 mg | Freq: Four times a day (QID) | INTRAMUSCULAR | Status: DC | PRN
  Administered 2020-02-05: 4 mg via INTRAVENOUS
  Filled 2020-02-05: qty 2

## 2020-02-05 MED ORDER — ATORVASTATIN CALCIUM 80 MG PO TABS
80.0000 mg | ORAL_TABLET | Freq: Every day | ORAL | Status: DC
  Administered 2020-02-05 – 2020-02-07 (×3): 80 mg via ORAL
  Filled 2020-02-05 (×4): qty 1

## 2020-02-05 MED ORDER — ATORVASTATIN CALCIUM 80 MG PO TABS: 80.0000 mg | ORAL_TABLET | Freq: Every day | ORAL | Status: DC

## 2020-02-05 MED ORDER — TIROFIBAN HCL IN NACL 5-0.9 MG/100ML-% IV SOLN
INTRAVENOUS | Status: AC
  Filled 2020-02-05: qty 100

## 2020-02-05 MED ORDER — FENTANYL CITRATE (PF) 100 MCG/2ML IJ SOLN
INTRAMUSCULAR | Status: AC
  Filled 2020-02-05: qty 2

## 2020-02-05 MED ORDER — HYDRALAZINE HCL 20 MG/ML IJ SOLN: 10.0000 mg | INTRAMUSCULAR | Status: AC | PRN

## 2020-02-05 MED ORDER — ASPIRIN 81 MG PO CHEW
324.0000 mg | CHEWABLE_TABLET | Freq: Once | ORAL | Status: AC
  Administered 2020-02-05: 324 mg via ORAL
  Filled 2020-02-05: qty 4

## 2020-02-05 MED ORDER — MORPHINE SULFATE (PF) 4 MG/ML IV SOLN
INTRAVENOUS | Status: AC
  Filled 2020-02-05: qty 1

## 2020-02-05 MED ORDER — TIROFIBAN (AGGRASTAT) BOLUS VIA INFUSION
INTRAVENOUS | Status: DC | PRN
  Administered 2020-02-05: 2040 ug via INTRAVENOUS

## 2020-02-05 SURGICAL SUPPLY — 23 items
BALLN  ~~LOC~~ SAPPHIRE 4.5X12 (BALLOONS) ×2
BALLN SAPPHIRE 2.5X12 (BALLOONS) ×2
BALLN ~~LOC~~ SAPPHIRE 4.5X12 (BALLOONS) ×1
BALLOON SAPPHIRE 2.5X12 (BALLOONS) IMPLANT
BALLOON ~~LOC~~ SAPPHIRE 4.5X12 (BALLOONS) IMPLANT
CATH 5FR JL3.5 JR4 ANG PIG MP (CATHETERS) ×1 IMPLANT
CATH OPTICROSS HD (CATHETERS) ×1 IMPLANT
CATH VISTA GUIDE 6FR XBLAD3.5 (CATHETERS) ×1 IMPLANT
DEVICE RAD COMP TR BAND LRG (VASCULAR PRODUCTS) ×1 IMPLANT
GLIDESHEATH SLEND SS 6F .021 (SHEATH) ×1 IMPLANT
GUIDEWIRE INQWIRE 1.5J.035X260 (WIRE) IMPLANT
INQWIRE 1.5J .035X260CM (WIRE) ×2
KIT ENCORE 26 ADVANTAGE (KITS) ×1 IMPLANT
KIT HEART LEFT (KITS) ×2 IMPLANT
PACK CARDIAC CATHETERIZATION (CUSTOM PROCEDURE TRAY) ×2 IMPLANT
SHEATH PROBE COVER 6X72 (BAG) ×1 IMPLANT
SLED PULL BACK IVUS (MISCELLANEOUS) ×1 IMPLANT
STENT SYNERGY XD 4.50X16 (Permanent Stent) IMPLANT
SYNERGY XD 4.50X16 (Permanent Stent) ×2 IMPLANT
SYR MEDRAD MARK 7 150ML (SYRINGE) ×1 IMPLANT
TRANSDUCER W/STOPCOCK (MISCELLANEOUS) ×2 IMPLANT
TUBING CIL FLEX 10 FLL-RA (TUBING) ×2 IMPLANT
WIRE COUGAR XT STRL 190CM (WIRE) ×1 IMPLANT
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
## 2020-02-05 NOTE — Progress Notes (Signed)
CRITICAL VALUE ALERT  Critical Value: 2319  Date & Time Notied:  02/05/20 1820  Provider Notified: None  Orders Received/Actions taken: Expected Value post procedure

## 2020-02-05 NOTE — Progress Notes (Signed)
Responded to ED page to support patient and staff.  Nurse indicated that chaplain service was not needed.  Patient going for further testing and maybe to cath lab.  Chaplain available as needed.  Paul Chandler,  [place], [facility], Pager [phone]

## 2020-02-05 NOTE — ED Triage Notes (Signed)
Pt arrives POV, pale diaphoretic w/ CP onset 20 mins PTA. States he took 1 SL NTG w/out relief, states this feels similar to his previous MI. Pt reports he smoked weed before arrival denies other drug use

## 2020-02-06 ENCOUNTER — Inpatient Hospital Stay (HOSPITAL_COMMUNITY): Payer: Medicaid Other

## 2020-02-06 ENCOUNTER — Inpatient Hospital Stay (HOSPITAL_COMMUNITY): Payer: Medicaid Other | Admitting: Certified Registered Nurse Anesthetist

## 2020-02-06 ENCOUNTER — Encounter (HOSPITAL_COMMUNITY)
Admission: EM | Disposition: A | Discharge status: Home / Self Care | Payer: Self-pay | Source: Home / Self Care | Attending: Cardiovascular Disease

## 2020-02-06 ENCOUNTER — Encounter: Payer: Self-pay | Admitting: Cardiology

## 2020-02-06 DIAGNOSIS — I2102 ST elevation (STEMI) myocardial infarction involving left anterior descending coronary artery: Secondary | ICD-10-CM

## 2020-02-06 DIAGNOSIS — I214 Non-ST elevation (NSTEMI) myocardial infarction: Secondary | ICD-10-CM

## 2020-02-06 DIAGNOSIS — R609 Edema, unspecified: Secondary | ICD-10-CM

## 2020-02-06 HISTORY — PX: TEE WITHOUT CARDIOVERSION: SHX5443

## 2020-02-06 HISTORY — PX: BUBBLE STUDY: SHX6837

## 2020-02-06 LAB — BASIC METABOLIC PANEL
Anion gap: 10 (ref 5–15)
BUN: 11 mg/dL (ref 6–20)
CO2: 24 mmol/L (ref 22–32)
Calcium: 9.3 mg/dL (ref 8.9–10.3)
Chloride: 105 mmol/L (ref 98–111)
Creatinine, Ser: 1.01 mg/dL (ref 0.61–1.24)
GFR calc Af Amer: >60 mL/min (ref >60)
GFR calc non Af Amer: >60 mL/min (ref >60)
Glucose, Bld: 111 mg/dL — ABNORMAL HIGH (ref 70–99)
Potassium: 3.9 mmol/L (ref 3.5–5.1)
Sodium: 139 mmol/L (ref 135–145)

## 2020-02-06 LAB — HEPATIC FUNCTION PANEL
ALT: 53 U/L — ABNORMAL HIGH (ref 0–44)
AST: 41 U/L (ref 15–41)
Albumin: 3.8 g/dL (ref 3.5–5.0)
Alkaline Phosphatase: 57 U/L (ref 38–126)
Bilirubin, Direct: <0.1 mg/dL (ref 0.0–0.2)
Total Bilirubin: 0.6 mg/dL (ref 0.3–1.2)
Total Protein: 6.4 g/dL — ABNORMAL LOW (ref 6.5–8.1)

## 2020-02-06 LAB — LIPID PANEL
Cholesterol: 283 mg/dL — ABNORMAL HIGH (ref 0–200)
HDL: 37 mg/dL — ABNORMAL LOW (ref >40)
LDL Cholesterol: 216 mg/dL — ABNORMAL HIGH (ref 0–99)
Total CHOL/HDL Ratio: 7.6 RATIO
Triglycerides: 148 mg/dL (ref <150)
VLDL: 30 mg/dL (ref 0–40)

## 2020-02-06 LAB — TSH: TSH: 1.568 u[IU]/mL (ref 0.350–4.500)

## 2020-02-06 LAB — CBC
HCT: 41.7 % (ref 39.0–52.0)
Hemoglobin: 13.6 g/dL (ref 13.0–17.0)
MCH: 28.5 pg (ref 26.0–34.0)
MCHC: 32.6 g/dL (ref 30.0–36.0)
MCV: 87.4 fL (ref 80.0–100.0)
Platelets: 282 10*3/uL (ref 150–400)
RBC: 4.77 MIL/uL (ref 4.22–5.81)
RDW: 12.9 % (ref 11.5–15.5)
WBC: 9.8 10*3/uL (ref 4.0–10.5)
nRBC: 0 % (ref 0.0–0.2)

## 2020-02-06 LAB — ECHOCARDIOGRAM COMPLETE
Height: 66 in
Weight: 2879.98 oz

## 2020-02-06 LAB — HIGH SENSITIVITY CRP: CRP, High Sensitivity: 4.79 mg/L — ABNORMAL HIGH (ref 0.00–3.00)

## 2020-02-06 LAB — PROTEIN C, TOTAL: Protein C, Total: 140 % (ref 60–150)

## 2020-02-06 SURGERY — ECHOCARDIOGRAM, TRANSESOPHAGEAL
Anesthesia: Monitor Anesthesia Care

## 2020-02-06 MED ORDER — PHENYLEPHRINE 40 MCG/ML (10ML) SYRINGE FOR IV PUSH (FOR BLOOD PRESSURE SUPPORT)
PREFILLED_SYRINGE | INTRAVENOUS | Status: DC | PRN
  Administered 2020-02-06: 160 ug via INTRAVENOUS

## 2020-02-06 MED ORDER — LIDOCAINE 2% (20 MG/ML) 5 ML SYRINGE
INTRAMUSCULAR | Status: DC | PRN
  Administered 2020-02-06: 100 mg via INTRAVENOUS

## 2020-02-06 MED ORDER — SODIUM CHLORIDE 0.9 % IV SOLN: INTRAVENOUS | Status: DC

## 2020-02-06 MED ORDER — ENOXAPARIN SODIUM 40 MG/0.4ML ~~LOC~~ SOLN
40.0000 mg | SUBCUTANEOUS | Status: DC
  Administered 2020-02-06 – 2020-02-08 (×3): 40 mg via SUBCUTANEOUS
  Filled 2020-02-06 (×3): qty 0.4

## 2020-02-06 MED ORDER — CHLORHEXIDINE GLUCONATE CLOTH 2 % EX PADS
6.0000 | MEDICATED_PAD | Freq: Every day | CUTANEOUS | Status: DC
  Administered 2020-02-06 – 2020-02-07 (×2): 6 via TOPICAL

## 2020-02-06 MED ORDER — TICAGRELOR 90 MG PO TABS: 90.0000 mg | ORAL_TABLET | Freq: Two times a day (BID) | ORAL | 3 refills | Status: DC

## 2020-02-06 MED ORDER — PROPOFOL 500 MG/50ML IV EMUL
INTRAVENOUS | Status: DC | PRN
  Administered 2020-02-06: 100 ug/kg/min via INTRAVENOUS

## 2020-02-06 MED ORDER — PROPOFOL 10 MG/ML IV BOLUS
INTRAVENOUS | Status: DC | PRN
  Administered 2020-02-06: 50 mg via INTRAVENOUS
  Administered 2020-02-06: 30 mg via INTRAVENOUS
  Administered 2020-02-06: 20 mg via INTRAVENOUS
  Administered 2020-02-06: 60 mg via INTRAVENOUS
  Administered 2020-02-06: 10 mg via INTRAVENOUS
  Administered 2020-02-06: 20 mg via INTRAVENOUS
  Administered 2020-02-06: 50 mg via INTRAVENOUS

## 2020-02-06 MED FILL — BRILINTA 90 MG TABLET: 90 | 30 days supply | Qty: 60 | Fill #0

## 2020-02-06 NOTE — Anesthesia Postprocedure Evaluation (Signed)
Anesthesia Post Note  Patient: Paul Chandler  Procedure(s) Performed: TRANSESOPHAGEAL ECHOCARDIOGRAM (TEE) (N/A ) BUBBLE STUDY     Patient location during evaluation: Endoscopy Anesthesia Type: MAC Level of consciousness: awake and alert Pain management: pain level controlled Vital Signs Assessment: post-procedure vital signs reviewed and stable Respiratory status: spontaneous breathing, nonlabored ventilation, respiratory function stable and patient connected to nasal cannula oxygen Cardiovascular status: stable and blood pressure returned to baseline Postop Assessment: no apparent nausea or vomiting Anesthetic complications: no    Last Vitals:  Vitals:   02/06/20 1209 02/06/20 1300  BP:  112/77  Pulse:  66  Resp:  (!) 23  Temp: 37 C   SpO2:  100%    Last Pain:  Vitals:   02/06/20 1200  TempSrc:   PainSc: 0-No pain                 Catalina Gravel

## 2020-02-06 NOTE — Progress Notes (Signed)
In anticipation of possible discharge this weekend, I have sent brilinta prescription to [facility] pharmacy for delivery to room today with coupon card.   Message sent to arrange OP follow up with Dr. Elease Hashimoto.

## 2020-02-06 NOTE — CV Procedure (Addendum)
TEE  Patient anesthetized with Propofol intravenouslly  Throat anesthetized with viscous lidocaine Bite guard placed TEE probe advanced through mouth to mid esophagus without difficulty   LA, LAA without masses Tiny PFO as tested with injection of agitated saline with some large bubbles seen later in left sided chambers.   Nothing seen by color doppler across atria or ventricles   TV normal AV normal MV normal PV normal  LVEF normal  RVEF normal  Normal thoracic aorta  Full report to follow .  Procedure was without complications.  Dietrich Pates MD

## 2020-02-06 NOTE — Progress Notes (Signed)
Bilateral lower extremity venous duplex completed. Refer to "CV Proc" under chart review to view preliminary results.  02/06/2020 12:43 PM Eula Fried., MHA, RVT, RDCS, RDMS

## 2020-02-06 NOTE — Progress Notes (Signed)
  Echocardiogram 2D Echocardiogram has been performed.  Paul Chandler 02/06/2020, 12:39 PM

## 2020-02-06 NOTE — Progress Notes (Signed)
CARDIAC REHAB PHASE I   PRE:  Rate/Rhythm: 99 SR    BP: sitting 116/77    SaO2: 99 RA  MODE:  Ambulation: 370 ft   POST:  Rate/Rhythm: 92 SR    BP: sitting 126/95     SaO2: 97 RA  Tolerated well, no c/o. Ed completed/reviewed (I had seen him in November). Pt sts his f/u appointments kept getting cancelled. Stressed the importance of meds, esp Brilinta. Will refer to [facility] CRPII, probably best for virtual CRPII.  [id number]   Harriet Masson CES, ACSM 02/06/2020 2:46 PM

## 2020-02-06 NOTE — Interval H&P Note (Signed)
History and Physical Interval Note:  02/06/2020 8:07 AM  Paul Chandler  has presented today for surgery, with the diagnosis of RULE OUT PSO.  The various methods of treatment have been discussed with the patient and family. After consideration of risks, benefits and other options for treatment, the patient has consented to  Procedure(s): TRANSESOPHAGEAL ECHOCARDIOGRAM (TEE) (N/A) as a surgical intervention.  The patient's history has been reviewed, patient examined, no change in status, stable for surgery.  I have reviewed the patient's chart and labs.  Questions were answered to the patient's satisfaction.     Dietrich Pates

## 2020-02-06 NOTE — Progress Notes (Signed)
Cardiology Progress Note  Patient ID: Paul Chandler MRN: [id number] DOB: 1991/02/16 Date of Encounter: 02/06/2020  Primary Cardiologist: Kristeen Miss, MD  Subjective  CP resolved. Tele stable.   ROS:  All other ROS reviewed and negative. Pertinent positives noted in the HPI.     Inpatient Medications  Scheduled Meds: . aspirin EC  81 mg Oral Daily  . atorvastatin  80 mg Oral QHS  . Chlorhexidine Gluconate Cloth  6 each Topical Daily  . heparin  4,000 Units Intravenous Once  . metoprolol succinate  25 mg Oral Daily  . sodium chloride flush  3 mL Intravenous Q12H  . ticagrelor  90 mg Oral BID   Continuous Infusions: . sodium chloride 10 mL/hr at 02/05/20 1313  . sodium chloride    . sodium chloride 20 mL/hr at 02/06/20 0754  . nitroGLYCERIN Stopped (02/06/20 0610)  . tirofiban 0.15 mcg/kg/min (02/06/20 0700)   PRN Meds: sodium chloride, acetaminophen, morphine injection, nitroGLYCERIN, ondansetron (ZOFRAN) IV, oxyCODONE-acetaminophen, sodium chloride flush   Vital Signs   Vitals:   02/06/20 0500 02/06/20 0600 02/06/20 0700 02/06/20 0748  BP: 100/64 114/81 118/69   Pulse: (!) 57 95 64   Resp: 19 20 19    Temp:    99.5 F (37.5 C)  TempSrc:    Oral  SpO2: 99% 96% 99%   Weight:      Height:        Intake/Output Summary (Last 24 hours) at 02/06/2020 0840 Last data filed at 02/06/2020 0700 Gross per 24 hour  Intake 1229.39 ml  Output 1375 ml  Net -145.61 ml   Last 3 Weights 02/05/2020 08/26/2019 08/25/2019  Weight (lbs) 180 lb 173 lb 11.6 oz 169 lb 8 oz  Weight (kg) 81.647 kg 78.8 kg 76.885 kg  Some encounter information is confidential and restricted. Go to Review Flowsheets activity to see all data.      Telemetry  Overnight telemetry shows SR 60s with PVCs, which I personally reviewed.   ECG  The most recent ECG shows SB 52 bpm, non-specific STT changes, which I personally reviewed.   Physical Exam   Vitals:   02/06/20 0500 02/06/20 0600 02/06/20 0700  02/06/20 0748  BP: 100/64 114/81 118/69   Pulse: (!) 57 95 64   Resp: 19 20 19    Temp:    99.5 F (37.5 C)  TempSrc:    Oral  SpO2: 99% 96% 99%   Weight:      Height:         Intake/Output Summary (Last 24 hours) at 02/06/2020 0840 Last data filed at 02/06/2020 0700 Gross per 24 hour  Intake 1229.39 ml  Output 1375 ml  Net -145.61 ml    Last 3 Weights 02/05/2020 08/26/2019 08/25/2019  Weight (lbs) 180 lb 173 lb 11.6 oz 169 lb 8 oz  Weight (kg) 81.647 kg 78.8 kg 76.885 kg  Some encounter information is confidential and restricted. Go to Review Flowsheets activity to see all data.    Body mass index is 29.05 kg/m.  General: Well nourished, well developed, in no acute distress Head: Atraumatic, normal size  Eyes: PEERLA, EOMI  Neck: Supple, no JVD Endocrine: No thryomegaly Cardiac: Normal S1, S2; RRR; no murmurs, rubs, or gallops Lungs: Clear to auscultation bilaterally, no wheezing, rhonchi or rales  Abd: Soft, nontender, no hepatomegaly  Ext: No edema, pulses 2+ Musculoskeletal: No deformities, BUE and BLE strength normal and equal Skin: Warm and dry, no rashes   Neuro: Alert and oriented  to person, place, time, and situation, CNII-XII grossly intact, no focal deficits  Psych: Normal mood and affect   Labs  High Sensitivity Troponin:   Recent Labs  Lab 02/05/20 1312 02/05/20 1712 02/05/20 1841 02/05/20 2034  TROPONINIHS 6 2,319* 9,767* 8,227*     Cardiac EnzymesNo results for input(s): TROPONINI in the last 168 hours. No results for input(s): TROPIPOC in the last 168 hours.  Chemistry Recent Labs  Lab 02/05/20 1312 02/05/20 1353 02/06/20 0223  NA 139 139 139  K 4.3 3.7 3.9  CL 103 103 105  CO2 22  --  24  GLUCOSE 135* 146* 111*  BUN 15 15 11   CREATININE 1.19 1.00 1.01  CALCIUM 9.7  --  9.3  PROT 7.4  --  6.4*  ALBUMIN 4.6  --  3.8  AST 37  --  41  ALT 64*  --  53*  ALKPHOS 67  --  57  BILITOT 1.2  --  0.6  GFRNONAA >60  --  >60  GFRAA >60  --  >60    ANIONGAP 14  --  10    Hematology Recent Labs  Lab 02/05/20 1312 02/05/20 1353 02/06/20 0223  WBC 7.4  --  9.8  RBC 5.20  --  4.77  HGB 14.6 14.6 13.6  HCT 46.4 43.0 41.7  MCV 89.2  --  87.4  MCH 28.1  --  28.5  MCHC 31.5  --  32.6  RDW 12.8  --  12.9  PLT 330  --  282   BNPNo results for input(s): BNP, PROBNP in the last 168 hours.  DDimer No results for input(s): DDIMER in the last 168 hours.   Radiology  CARDIAC CATHETERIZATION  Addendum Date: 02/05/2020    Prox LAD lesion is 50% stenosed.  A drug-eluting stent was successfully placed using a SYNERGY XD 4.50X16.  Post intervention, there is a 0% residual stenosis.  The left ventricular systolic function is normal.  LV end diastolic pressure is normal.  The left ventricular ejection fraction is greater than 65% by visual estimate.  There is no mitral valve regurgitation.  Dist LAD lesion is 100% stenosed.  2nd Diag lesion is 90% stenosed.  1. Non-obstructive thrombotic lesion in the proximal LAD with downstream thrombotic occlusion of the apical LAD. IVUS imaging demonstrated thrombus in the proximal LAD. After careful review with the IC and imaging team, we decided to proceed with PCI and stenting of the proximal LAD. Successful PTCA/DES x 1 proximal LAD. 2. No obstructive disease in the Circumflex or RCA 3. Hyperdynamic LV systolic function with subtle apical WMA. Continue DAPT with ASA and Plavix for at least one year. Aggrastat drip for 18 hours. High intensity statin. Will restart beta blocker.   Result Date: 02/05/2020  Prox LAD lesion is 50% stenosed.  A drug-eluting stent was successfully placed using a SYNERGY XD 4.50X16.  Post intervention, there is a 0% residual stenosis.  The left ventricular systolic function is normal.  LV end diastolic pressure is normal.  The left ventricular ejection fraction is greater than 65% by visual estimate.  There is no mitral valve regurgitation.  1. Non-obstructive thrombotic  lesion in the proximal LAD with downstream thrombotic occlusion of the apical LAD. IVUS imaging demonstrated thrombus in the proximal LAD. After careful review with the IC and imaging team, we decided to proceed with PCI and stenting of the proximal LAD. Successful PTCA/DES x 1 proximal LAD. 2. No obstructive disease in the Circumflex or  RCA 3. Hyperdynamic LV systolic function with subtle apical WMA. Continue DAPT with ASA and Plavix for at least one year. Aggrastat drip for 18 hours. High intensity statin. Will restart beta blocker.    Cardiac Studies  LHC 02/05/2020  Prox LAD lesion is 50% stenosed.  A drug-eluting stent was successfully placed using a SYNERGY XD 4.50X16.  Post intervention, there is a 0% residual stenosis.  The left ventricular systolic function is normal.  LV end diastolic pressure is normal.  The left ventricular ejection fraction is greater than 65% by visual estimate.  There is no mitral valve regurgitation.  Dist LAD lesion is 100% stenosed.  2nd Diag lesion is 90% stenosed.   1. Non-obstructive thrombotic lesion in the proximal LAD with downstream thrombotic occlusion of the apical LAD. IVUS imaging demonstrated thrombus in the proximal LAD. After careful review with the IC and imaging team, we decided to proceed with PCI and stenting of the proximal LAD. Successful PTCA/DES x 1 proximal LAD.  2. No obstructive disease in the Circumflex or RCA 3. Hyperdynamic LV systolic function with subtle apical WMA.   Continue DAPT with ASA and Plavix for at least one year. Aggrastat drip for 18 hours. High intensity statin. Will restart beta blocker.   Patient Profile  Paul Chandler is a 29 y.o. male with history of CAD, HLD, marijuana use who was admitted with STEMI 02/05/2020 with proximal LAD thrombus.   Assessment & Plan   1. STEMI -50% thrombus in the pLAD s/p PCI.  -had distal thrombotic event to distal LAD and diagonal branch -had this in 07/2019 that was  treated medically -he only took meds for 1 month -early CAD (FH?) vs hypercoagulable issue is unclear. I think PCI was merited as his thrombus never resolved -continue ASA 81 mg QD, brilinta 90 mg BID (can get him 1 month free at discharge then plavix), lipitor 80 mg QD -echo pending  -18 hours aggrastat per interventional  -needs FH genetic testing and we will plan to do this as outpatient  -we have started a work-up for hypercoag state: factor V leiden, prothrombin gene, lupus anticoagulant. ATIII and protein C/S will need to be repeated as drawn on [facility] LE duplex pending. Young age merits TEE in my opinion and we will do this today. If negative this is just early CAD.   2. HLD, suspected FH -LDL 216 -no strong family history -I would like to send genetic panel when we see him in follow-up for FH  3. Marijuana Use -needs to quit  FEN -no IVF -npo for TEE -code: full -dvt ppx: lovenox  Disposition: Will remain in ICU for 24 hours then to floor.   For questions or updates, please contact [place] Please consult [email or web address] for contact info under   Time Spent with Patient: I have spent a total of 25 minutes with patient reviewing hospital notes, telemetry, EKGs, labs and examining the patient as well as establishing an assessment and plan that was discussed with the patient.  > 50% of time was spent in direct patient care.    Signed, Addison Naegeli. Audie Box, [place]  02/06/2020 8:40 AM

## 2020-02-06 NOTE — Progress Notes (Addendum)
  Echocardiogram Echocardiogram Transesophageal has been performed.  Paul Chandler 02/06/2020, 10:27 AM

## 2020-02-06 NOTE — Anesthesia Preprocedure Evaluation (Signed)
Anesthesia Evaluation  Patient identified by MRN, date of birth, ID band Patient awake    Reviewed: Allergy & Precautions, NPO status , Patient's Chart, lab work & pertinent test results, reviewed documented beta blocker date and time   History of Anesthesia Complications Negative for: history of anesthetic complications  Airway Mallampati: II  TM Distance: >3 FB Neck ROM: Full    Dental  (+) Teeth Intact, Dental Advisory Given   Pulmonary Current Smoker and Patient abstained from smoking.,    Pulmonary exam normal breath sounds clear to auscultation       Cardiovascular hypertension, Pt. on home beta blockers + CAD, + Past MI and + Cardiac Stents  Normal cardiovascular exam Rhythm:Regular Rate:Normal     Neuro/Psych negative neurological ROS  negative psych ROS   GI/Hepatic negative GI ROS, (+)     substance abuse  marijuana use,   Endo/Other  negative endocrine ROS  Renal/GU negative Renal ROS     Musculoskeletal negative musculoskeletal ROS (+)   Abdominal   Peds  Hematology  (+) Blood dyscrasia (Plavix), ,   Anesthesia Other Findings Day of surgery medications reviewed with the patient.  Reproductive/Obstetrics                             Anesthesia Physical Anesthesia Plan  ASA: III  Anesthesia Plan: MAC   Post-op Pain Management:    Induction: Intravenous  PONV Risk Score and Plan: 1 and Propofol infusion and Treatment may vary due to age or medical condition  Airway Management Planned: Natural Airway and Nasal Cannula  Additional Equipment:   Intra-op Plan:   Post-operative Plan:   Informed Consent: I have reviewed the patients History and Physical, chart, labs and discussed the procedure including the risks, benefits and alternatives for the proposed anesthesia with the patient or authorized representative who has indicated his/her understanding and acceptance.      Dental advisory given  Plan Discussed with: CRNA  Anesthesia Plan Comments:         Anesthesia Quick Evaluation

## 2020-02-06 NOTE — Transfer of Care (Signed)
Immediate Anesthesia Transfer of Care Note  Patient: Paul Chandler  Procedure(s) Performed: TRANSESOPHAGEAL ECHOCARDIOGRAM (TEE) (N/A )  Patient Location: Endoscopy Unit  Anesthesia Type:MAC  Level of Consciousness: drowsy and patient cooperative  Airway & Oxygen Therapy: Patient Spontanous Breathing  Post-op Assessment: Report given to RN and Post -op Vital signs reviewed and stable  Post vital signs: Reviewed and stable  Last Vitals:  Vitals Value Taken Time  BP 116/60 02/06/20 1017  Temp 36.5 C 02/06/20 1017  Pulse 85 02/06/20 1018  Resp 26 02/06/20 1018  SpO2 99 % 02/06/20 1018  Vitals shown include unvalidated device data.  Last Pain:  Vitals:   02/06/20 1017  TempSrc: Axillary  PainSc:       Patients Stated Pain Goal: 0 (43/20/03 7944)  Complications: No apparent anesthesia complications

## 2020-02-07 LAB — BASIC METABOLIC PANEL
Anion gap: 11 (ref 5–15)
BUN: 16 mg/dL (ref 6–20)
CO2: 24 mmol/L (ref 22–32)
Calcium: 9.9 mg/dL (ref 8.9–10.3)
Chloride: 103 mmol/L (ref 98–111)
Creatinine, Ser: 1.22 mg/dL (ref 0.61–1.24)
GFR calc Af Amer: >60 mL/min (ref >60)
GFR calc non Af Amer: >60 mL/min (ref >60)
Glucose, Bld: 99 mg/dL (ref 70–99)
Potassium: 4.6 mmol/L (ref 3.5–5.1)
Sodium: 138 mmol/L (ref 135–145)

## 2020-02-07 LAB — BETA-2-GLYCOPROTEIN I ABS, IGG/M/A
Beta-2 Glyco I IgG: <9 GPI IgG units (ref 0–20)
Beta-2-Glycoprotein I IgA: <9 GPI IgA units (ref 0–25)
Beta-2-Glycoprotein I IgM: <9 GPI IgM units (ref 0–32)

## 2020-02-07 LAB — MRSA PCR SCREENING: MRSA by PCR: NEGATIVE

## 2020-02-07 LAB — CARDIOLIPIN ANTIBODIES, IGG, IGM, IGA
Anticardiolipin IgA: <9 APL U/mL (ref 0–11)
Anticardiolipin IgG: <9 GPL U/mL (ref 0–14)
Anticardiolipin IgM: <9 MPL U/mL (ref 0–12)

## 2020-02-07 NOTE — Progress Notes (Signed)
Cardiology Progress Note  Patient ID: Paul Chandler MRN: [id number] DOB: [birth year] Date of Encounter: 02/07/2020  Primary Cardiologist: Kristeen Miss, MD  Subjective  No further CP. Transferred to floor yesterday.   ROS:  All other ROS reviewed and negative. Pertinent positives noted in the HPI.     Inpatient Medications  Scheduled Meds: . aspirin EC  81 mg Oral Daily  . atorvastatin  80 mg Oral QHS  . Chlorhexidine Gluconate Cloth  6 each Topical Daily  . enoxaparin (LOVENOX) injection  40 mg Subcutaneous Q24H  . metoprolol succinate  25 mg Oral Daily  . sodium chloride flush  3 mL Intravenous Q12H  . ticagrelor  90 mg Oral BID   Continuous Infusions: . sodium chloride 10 mL/hr at 02/05/20 1313  . sodium chloride     PRN Meds: sodium chloride, acetaminophen, morphine injection, nitroGLYCERIN, ondansetron (ZOFRAN) IV, oxyCODONE-acetaminophen, sodium chloride flush   Vital Signs   Vitals:   02/07/20 0057 02/07/20 0339 02/07/20 0828 02/07/20 1153  BP: 118/70 114/72 114/75 115/73  Pulse: 69 68 72 66  Resp: 17 16 19 20   Temp: 98.1 F (36.7 C) 97.7 F (36.5 C) 97.7 F (36.5 C) 98.1 F (36.7 C)  TempSrc: Oral Oral Oral Oral  SpO2: 98% 99% 97% 98%  Weight: 82.1 kg     Height: 5\' 6"  (1.676 m)       Intake/Output Summary (Last 24 hours) at 02/07/2020 1215 Last data filed at 02/07/2020 1202 Gross per 24 hour  Intake 240 ml  Output 850 ml  Net -610 ml   Last 3 Weights 02/07/2020 02/06/2020 02/05/2020  Weight (lbs) 181 lb 1.6 oz 180 lb 180 lb  Weight (kg) 82.146 kg 81.647 kg 81.647 kg  Some encounter information is confidential and restricted. Go to Review Flowsheets activity to see all data.      Telemetry  Overnight telemetry shows SR 60s, which I personally reviewed.   ECG  The most recent ECG shows SB 52 bpm, non-specific STT changes, which I personally reviewed.   Physical Exam   Vitals:   02/07/20 0057 02/07/20 0339 02/07/20 0828 02/07/20 1153  BP: 118/70  114/72 114/75 115/73  Pulse: 69 68 72 66  Resp: 17 16 19 20   Temp: 98.1 F (36.7 C) 97.7 F (36.5 C) 97.7 F (36.5 C) 98.1 F (36.7 C)  TempSrc: Oral Oral Oral Oral  SpO2: 98% 99% 97% 98%  Weight: 82.1 kg     Height: 5\' 6"  (1.676 m)        Intake/Output Summary (Last 24 hours) at 02/07/2020 1215 Last data filed at 02/07/2020 1202 Gross per 24 hour  Intake 240 ml  Output 850 ml  Net -610 ml    Last 3 Weights 02/07/2020 02/06/2020 02/05/2020  Weight (lbs) 181 lb 1.6 oz 180 lb 180 lb  Weight (kg) 82.146 kg 81.647 kg 81.647 kg  Some encounter information is confidential and restricted. Go to Review Flowsheets activity to see all data.    Body mass index is 29.23 kg/m.   General: Well nourished, well developed, in no acute distress Head: Atraumatic, normal size  Eyes: PEERLA, EOMI  Neck: Supple, no JVD Endocrine: No thryomegaly Cardiac: Normal S1, S2; RRR; no murmurs, rubs, or gallops Lungs: Clear to auscultation bilaterally, no wheezing, rhonchi or rales  Abd: Soft, nontender, no hepatomegaly  Ext: No edema, pulses 2+ Musculoskeletal: No deformities, BUE and BLE strength normal and equal Skin: Warm and dry, no rashes   Neuro: Alert  and oriented to person, place, time, and situation, CNII-XII grossly intact, no focal deficits  Psych: Normal mood and affect   Labs  High Sensitivity Troponin:   Recent Labs  Lab 02/05/20 1312 02/05/20 1712 02/05/20 1841 02/05/20 2034  TROPONINIHS 6 2,319* 1,6104,612* 8,227*     Cardiac EnzymesNo results for input(s): TROPONINI in the last 168 hours. No results for input(s): TROPIPOC in the last 168 hours.  Chemistry Recent Labs  Lab 02/05/20 1312 02/05/20 1312 02/05/20 1353 02/06/20 0223 02/07/20 0741  NA 139   < > 139 139 138  K 4.3   < > 3.7 3.9 4.6  CL 103   < > 103 105 103  CO2 22  --   --  24 24  GLUCOSE 135*   < > 146* 111* 99  BUN 15   < > 15 11 16   CREATININE 1.19   < > 1.00 1.01 1.22  CALCIUM 9.7  --   --  9.3 9.9  PROT 7.4   --   --  6.4*  --   ALBUMIN 4.6  --   --  3.8  --   AST 37  --   --  41  --   ALT 64*  --   --  53*  --   ALKPHOS 67  --   --  57  --   BILITOT 1.2  --   --  0.6  --   GFRNONAA >60  --   --  >60 >60  GFRAA >60  --   --  >60 >60  ANIONGAP 14  --   --  10 11   < > = values in this interval not displayed.    Hematology Recent Labs  Lab 02/05/20 1312 02/05/20 1353 02/06/20 0223  WBC 7.4  --  9.8  RBC 5.20  --  4.77  HGB 14.6 14.6 13.6  HCT 46.4 43.0 41.7  MCV 89.2  --  87.4  MCH 28.1  --  28.5  MCHC 31.5  --  32.6  RDW 12.8  --  12.9  PLT 330  --  282   BNPNo results for input(s): BNP, PROBNP in the last 168 hours.  DDimer No results for input(s): DDIMER in the last 168 hours.   Radiology  CARDIAC CATHETERIZATION  Addendum Date: 02/05/2020    Prox LAD lesion is 50% stenosed.  A drug-eluting stent was successfully placed using a SYNERGY XD 4.50X16.  Post intervention, there is a 0% residual stenosis.  The left ventricular systolic function is normal.  LV end diastolic pressure is normal.  The left ventricular ejection fraction is greater than 65% by visual estimate.  There is no mitral valve regurgitation.  Dist LAD lesion is 100% stenosed.  2nd Diag lesion is 90% stenosed.  1. Non-obstructive thrombotic lesion in the proximal LAD with downstream thrombotic occlusion of the apical LAD. IVUS imaging demonstrated thrombus in the proximal LAD. After careful review with the IC and imaging team, we decided to proceed with PCI and stenting of the proximal LAD. Successful PTCA/DES x 1 proximal LAD. 2. No obstructive disease in the Circumflex or RCA 3. Hyperdynamic LV systolic function with subtle apical WMA. Continue DAPT with ASA and Plavix for at least one year. Aggrastat drip for 18 hours. High intensity statin. Will restart beta blocker.   Result Date: 02/05/2020  Prox LAD lesion is 50% stenosed.  A drug-eluting stent was successfully placed using a SYNERGY XD 4.50X16.  Post  intervention, there is a 0% residual stenosis.  The left ventricular systolic function is normal.  LV end diastolic pressure is normal.  The left ventricular ejection fraction is greater than 65% by visual estimate.  There is no mitral valve regurgitation.  1. Non-obstructive thrombotic lesion in the proximal LAD with downstream thrombotic occlusion of the apical LAD. IVUS imaging demonstrated thrombus in the proximal LAD. After careful review with the IC and imaging team, we decided to proceed with PCI and stenting of the proximal LAD. Successful PTCA/DES x 1 proximal LAD. 2. No obstructive disease in the Circumflex or RCA 3. Hyperdynamic LV systolic function with subtle apical WMA. Continue DAPT with ASA and Plavix for at least one year. Aggrastat drip for 18 hours. High intensity statin. Will restart beta blocker.   ECHOCARDIOGRAM COMPLETE  Result Date: 02/06/2020    ECHOCARDIOGRAM REPORT   Patient Name:   Paul Chandler Date of Exam: 02/06/2020 Medical Rec #:  [id number]      Height:       66.0 in Accession #:    [id number]     Weight:       180.0 lb Date of Birth:  May 24, 1991      BSA:          1.912 m Patient Age:    28 years       BP:           117/48 mmHg Patient Gender: M              HR:           70 bpm. Exam Location:  Inpatient Procedure: 2D Echo, Cardiac Doppler and Color Doppler Indications:    CAD of Native Vessel 414.01 / I25.10  History:        Patient has prior history of Echocardiogram examinations, most                 recent 02/06/2020. Previous Myocardial Infarction; Risk                 Factors:Dyslipidemia.  Sonographer:    Elmarie Shiley Dance Referring Phys: [id number] CHRISTOPHER D MCALHANY IMPRESSIONS  1. Distal septal and apical hypokinesis . Left ventricular ejection fraction, by estimation, is 50 to 55%. The left ventricle has low normal function. The left ventricle demonstrates regional wall motion abnormalities (see scoring diagram/findings for description). Left ventricular diastolic  parameters were normal.  2. Right ventricular systolic function is normal. The right ventricular size is normal.  3. The mitral valve is normal in structure. Trivial mitral valve regurgitation. No evidence of mitral stenosis.  4. The aortic valve is tricuspid. Aortic valve regurgitation is not visualized. No aortic stenosis is present.  5. The inferior vena cava is normal in size with greater than 50% respiratory variability, suggesting right atrial pressure of 3 mmHg. FINDINGS  Left Ventricle: Distal septal and apical hypokinesis. Left ventricular ejection fraction, by estimation, is 50 to 55%. The left ventricle has low normal function. The left ventricle demonstrates regional wall motion abnormalities. The left ventricular internal cavity size was normal in size. There is no left ventricular hypertrophy. Left ventricular diastolic parameters were normal. Right Ventricle: The right ventricular size is normal. No increase in right ventricular wall thickness. Right ventricular systolic function is normal. Left Atrium: Left atrial size was normal in size. Right Atrium: Right atrial size was normal in size. Pericardium: Trivial pericardial effusion is present. Mitral Valve: The mitral valve is normal in structure. There is mild  thickening of the mitral valve leaflet(s). Normal mobility of the mitral valve leaflets. Trivial mitral valve regurgitation. No evidence of mitral valve stenosis. Tricuspid Valve: The tricuspid valve is normal in structure. Tricuspid valve regurgitation is trivial. No evidence of tricuspid stenosis. Aortic Valve: The aortic valve is tricuspid. Aortic valve regurgitation is not visualized. No aortic stenosis is present. Pulmonic Valve: The pulmonic valve was normal in structure. Pulmonic valve regurgitation is not visualized. No evidence of pulmonic stenosis. Aorta: The aortic root is normal in size and structure. Venous: The inferior vena cava is normal in size with greater than 50% respiratory  variability, suggesting right atrial pressure of 3 mmHg. IAS/Shunts: No atrial level shunt detected by color flow Doppler.  LEFT VENTRICLE PLAX 2D LVIDd:         5.00 cm  Diastology LVIDs:         3.20 cm  LV e' lateral:   13.70 cm/s LV PW:         0.80 cm  LV E/e' lateral: 6.4 LV IVS:        1.00 cm  LV e' medial:    7.51 cm/s LVOT diam:     2.00 cm  LV E/e' medial:  11.6 LV SV:         39 LV SV Index:   20 LVOT Area:     3.14 cm  RIGHT VENTRICLE             IVC RV Basal diam:  2.50 cm     IVC diam: 1.90 cm RV S prime:     13.10 cm/s TAPSE (M-mode): 2.2 cm LEFT ATRIUM             Index       RIGHT ATRIUM           Index LA diam:        3.40 cm 1.78 cm/m  RA Area:     13.20 cm LA Vol (A2C):   59.7 ml 31.22 ml/m RA Volume:   30.80 ml  16.11 ml/m LA Vol (A4C):   39.1 ml 20.45 ml/m LA Biplane Vol: 50.5 ml 26.41 ml/m  AORTIC VALVE LVOT Vmax:   55.50 cm/s LVOT Vmean:  38.100 cm/s LVOT VTI:    0.124 m  AORTA Ao Root diam: 3.10 cm Ao Asc diam:  2.60 cm MITRAL VALVE MV Area (PHT): 3.65 cm    SHUNTS MV Decel Time: 208 msec    Systemic VTI:  0.12 m MV E velocity: 87.00 cm/s  Systemic Diam: 2.00 cm MV A velocity: 51.40 cm/s MV E/A ratio:  1.69 Charlton Haws MD Electronically signed by Charlton Haws MD Signature Date/Time: 02/06/2020/12:58:19 PM    Final    ECHO TEE  Result Date: 02/06/2020    TRANSESOPHOGEAL ECHO REPORT   Patient Name:   Paul Chandler Date of Exam: 02/06/2020 Medical Rec #:  [id number]      Height:       66.0 in Accession #:    [id number]     Weight:       180.0 lb Date of Birth:  1991/05/12      BSA:          1.912 m Patient Age:    28 years       BP:           116/60 mmHg Patient Gender: M              HR:  87 bpm. Exam Location:  Inpatient Procedure: 2D Echo Indications:     Non-STEMI (non-ST elevated myocardial infarction)  History:         Patient has prior history of Echocardiogram examinations, most                  recent 08/23/2019. Non-STEMI (non-ST elevated myocardial                   infarction); Risk Factors:Dyslipidemia.  Sonographer:     Vikki Ports Turrentine Referring Phys:  [place] Diagnosing Phys: Dorris Carnes MD PROCEDURE: The transesophogeal probe was passed without difficulty through the esophogus of the patient. Sedation performed by different physician. The patient was monitored while under deep sedation. Anesthestetic sedation was provided intravenously by Anesthesiology: 407mg  of Propofol, 100mg  of Lidocaine. The patient developed no complications during the procedure. IMPRESSIONS  1. Left ventricular ejection fraction, by estimation, is 60 to 65%. The left ventricle has normal function. The left ventricle has no regional wall motion abnormalities.  2. Right ventricular systolic function is normal. The right ventricular size is normal.  3. No left atrial/left atrial appendage thrombus was detected.  4. Very small PFO as tested with injection of agitated saline with few bubbles (larger) seen in LA late. Unable to see by color doppler.  5. The mitral valve is normal in structure. Trivial mitral valve regurgitation.  6. The aortic valve is normal in structure. Aortic valve regurgitation is not visualized. FINDINGS  Left Ventricle: Left ventricular ejection fraction, by estimation, is 60 to 65%. The left ventricle has normal function. The left ventricle has no regional wall motion abnormalities. The left ventricular internal cavity size was normal in size. There is  no left ventricular hypertrophy. Right Ventricle: The right ventricular size is normal. No increase in right ventricular wall thickness. Right ventricular systolic function is normal. Left Atrium: Left atrial size was normal in size. No left atrial/left atrial appendage thrombus was detected. Right Atrium: Right atrial size was normal in size. Pericardium: There is no evidence of pericardial effusion. Mitral Valve: The mitral valve is normal in structure. Trivial mitral valve regurgitation. Tricuspid Valve: The  tricuspid valve is normal in structure. Tricuspid valve regurgitation is mild. Aortic Valve: The aortic valve is normal in structure. Aortic valve regurgitation is not visualized. Pulmonic Valve: The pulmonic valve was normal in structure. Pulmonic valve regurgitation is not visualized. Aorta: The aortic root and ascending aorta are structurally normal, with no evidence of dilitation. IAS/Shunts: Evidence of atrial level shunting detected by color flow Doppler. Agitated saline contrast was given intravenously to evaluate for intracardiac shunting. Agitated saline contrast bubble study was positive with shunting observed within 3-6 cardiac cycles suggestive of interatrial shunt. A small patent foramen ovale is detected. Very small PFO as tested with injection of agitated saline with few bubbles (larger) seen in LA late. Unable to see by color doppler. Dorris Carnes MD Electronically signed by Dorris Carnes MD Signature Date/Time: 02/06/2020/5:37:24 PM    Final    VAS [place] LOWER EXTREMITY VENOUS (DVT)  Result Date: 02/06/2020  Lower Venous DVTStudy Indications: Edema.  Limitations: Poor ultrasound/tissue interface. Comparison Study: No prior study Performing Technologist: Maudry Mayhew MHA, RDMS, RVT, RDCS  Examination Guidelines: A complete evaluation includes B-mode imaging, spectral Doppler, color Doppler, and power Doppler as needed of all accessible portions of each vessel. Bilateral testing is considered an integral part of a complete examination. Limited examinations for reoccurring indications may be performed as noted.  The reflux portion of the exam is performed with the patient in reverse Trendelenburg.  +---------+---------------+---------+-----------+----------+--------------+ RIGHT    CompressibilityPhasicitySpontaneityPropertiesThrombus Aging +---------+---------------+---------+-----------+----------+--------------+ CFV      Full           Yes      Yes                                  +---------+---------------+---------+-----------+----------+--------------+ SFJ      Full                                                        +---------+---------------+---------+-----------+----------+--------------+ FV Prox  Full                                                        +---------+---------------+---------+-----------+----------+--------------+ FV Mid   Full                                                        +---------+---------------+---------+-----------+----------+--------------+ FV DistalFull                                                        +---------+---------------+---------+-----------+----------+--------------+ PFV      Full                                                        +---------+---------------+---------+-----------+----------+--------------+ POP      Full           Yes      Yes                                 +---------+---------------+---------+-----------+----------+--------------+ PTV      Full                                                        +---------+---------------+---------+-----------+----------+--------------+ PERO     Full                                                        +---------+---------------+---------+-----------+----------+--------------+   +---------+---------------+---------+-----------+----------+--------------+ LEFT     CompressibilityPhasicitySpontaneityPropertiesThrombus Aging +---------+---------------+---------+-----------+----------+--------------+ CFV      Full           Yes  Yes                                 +---------+---------------+---------+-----------+----------+--------------+ SFJ      Full                                                        +---------+---------------+---------+-----------+----------+--------------+ FV Prox  Full                                                         +---------+---------------+---------+-----------+----------+--------------+ FV Mid   Full                                                        +---------+---------------+---------+-----------+----------+--------------+ FV DistalFull                                                        +---------+---------------+---------+-----------+----------+--------------+ PFV      Full                                                        +---------+---------------+---------+-----------+----------+--------------+ POP      Full           Yes      Yes                                 +---------+---------------+---------+-----------+----------+--------------+ PTV      Full                                                        +---------+---------------+---------+-----------+----------+--------------+ PERO     Full                                                        +---------+---------------+---------+-----------+----------+--------------+     Summary: RIGHT: - There is no evidence of deep vein thrombosis in the lower extremity.  - No cystic structure found in the popliteal fossa.  LEFT: - There is no evidence of deep vein thrombosis in the lower extremity.  - No cystic structure found in the popliteal fossa.  *See table(s) above for measurements and observations. Electronically signed by Sherald Hess MD on 02/06/2020 at 4:02:42 PM.    Final  Cardiac Studies  LHC 02/05/2020  Prox LAD lesion is 50% stenosed.  A drug-eluting stent was successfully placed using a SYNERGY XD 4.50X16.  Post intervention, there is a 0% residual stenosis.  The left ventricular systolic function is normal.  LV end diastolic pressure is normal.  The left ventricular ejection fraction is greater than 65% by visual estimate.  There is no mitral valve regurgitation.  Dist LAD lesion is 100% stenosed.  2nd Diag lesion is 90% stenosed.   1. Non-obstructive thrombotic lesion in the  proximal LAD with downstream thrombotic occlusion of the apical LAD. IVUS imaging demonstrated thrombus in the proximal LAD. After careful review with the IC and imaging team, we decided to proceed with PCI and stenting of the proximal LAD. Successful PTCA/DES x 1 proximal LAD.  2. No obstructive disease in the Circumflex or RCA 3. Hyperdynamic LV systolic function with subtle apical WMA.   Continue DAPT with ASA and Plavix for at least one year. Aggrastat drip for 18 hours. High intensity statin. Will restart beta blocker.    TEE 02/06/2020 1. Left ventricular ejection fraction, by estimation, is 60 to 65%. The  left ventricle has normal function. The left ventricle has no regional  wall motion abnormalities.  2. Right ventricular systolic function is normal. The right ventricular  size is normal.  3. No left atrial/left atrial appendage thrombus was detected.  4. Very small PFO as tested with injection of agitated saline with few  bubbles (larger) seen in LA late. Unable to see by color doppler.  5. The mitral valve is normal in structure. Trivial mitral valve  regurgitation.  6. The aortic valve is normal in structure. Aortic valve regurgitation is  not visualized.   TTE 02/06/2020 1. Distal septal and apical hypokinesis . Left ventricular ejection  fraction, by estimation, is 50 to 55%. The left ventricle has low normal  function. The left ventricle demonstrates regional wall motion  abnormalities (see scoring diagram/findings for  description). Left ventricular diastolic parameters were normal.  2. Right ventricular systolic function is normal. The right ventricular  size is normal.  3. The mitral valve is normal in structure. Trivial mitral valve  regurgitation. No evidence of mitral stenosis.  4. The aortic valve is tricuspid. Aortic valve regurgitation is not  visualized. No aortic stenosis is present.  5. The inferior vena cava is normal in size with greater than  50%  respiratory variability, suggesting right atrial pressure of 3 mmHg.   Patient Profile  Paul Chandler is a 29 y.o. male with history of CAD, HLD, marijuana use who was admitted with STEMI 02/05/2020 with proximal LAD thrombus.   Assessment & Plan   1. STEMI -50% thrombus in the pLAD s/p PCI.  -had distal thrombotic event to distal LAD and diagonal branch -had this in 07/2019 that was treated medically -he only took meds for 1 month -early CAD (FH?) vs hypercoagulable issue is unclear. I think PCI was merited as his thrombus never resolved -continue ASA 81 mg QD, brilinta 90 mg BID (can get him 1 month free at discharge then plavix), lipitor 80 mg QD -echo with distal septal WMA -needs FH genetic testing and we will plan to do this as outpatient  -we have started a work-up for hypercoag state: factor V leiden, prothrombin gene, lupus anticoagulant. ATIII and protein C/S will need to be repeated as drawn on AC.  - LE duplex negative. - TEE with possible very small PFO vs. Pulmonary AVM. I  ordered transcranial doppler with bubble but may need to be done as outpatient.   2. HLD, suspected FH -LDL 216 -no strong family history -I would like to send genetic panel when we see him in follow-up for FH  3. Marijuana Use -needs to quit  FEN -no IVF -diet: heart healthy  -code: full -dvt ppx: lovenox  Disposition: DC tomorrow.   For questions or updates, please contact [facility] Please consult [email or web address] for contact info under   Time Spent with Patient: I have spent a total of 25 minutes with patient reviewing hospital notes, telemetry, EKGs, labs and examining the patient as well as establishing an assessment and plan that was discussed with the patient.  > 50% of time was spent in direct patient care.    Signed, Lenna Gilford. Flora Lipps, MD [facility]  02/07/2020 12:15 PM

## 2020-02-08 ENCOUNTER — Encounter (HOSPITAL_COMMUNITY): Payer: Self-pay | Admitting: Cardiovascular Disease

## 2020-02-08 DIAGNOSIS — F1290 Cannabis use, unspecified, uncomplicated: Secondary | ICD-10-CM

## 2020-02-08 DIAGNOSIS — I2510 Atherosclerotic heart disease of native coronary artery without angina pectoris: Secondary | ICD-10-CM

## 2020-02-08 DIAGNOSIS — E785 Hyperlipidemia, unspecified: Secondary | ICD-10-CM

## 2020-02-08 DIAGNOSIS — I255 Ischemic cardiomyopathy: Secondary | ICD-10-CM

## 2020-02-08 DIAGNOSIS — R7303 Prediabetes: Secondary | ICD-10-CM

## 2020-02-08 LAB — LUPUS ANTICOAGULANT PANEL
DRVVT: 33.4 s (ref 0.0–47.0)
PTT Lupus Anticoagulant: 30.7 s (ref 0.0–51.9)

## 2020-02-08 LAB — BASIC METABOLIC PANEL
Anion gap: 9 (ref 5–15)
BUN: 17 mg/dL (ref 6–20)
CO2: 25 mmol/L (ref 22–32)
Calcium: 10.1 mg/dL (ref 8.9–10.3)
Chloride: 103 mmol/L (ref 98–111)
Creatinine, Ser: 1.18 mg/dL (ref 0.61–1.24)
GFR calc Af Amer: >60 mL/min (ref >60)
GFR calc non Af Amer: >60 mL/min (ref >60)
Glucose, Bld: 100 mg/dL — ABNORMAL HIGH (ref 70–99)
Potassium: 4.5 mmol/L (ref 3.5–5.1)
Sodium: 137 mmol/L (ref 135–145)

## 2020-02-08 LAB — PROTEIN S, TOTAL: Protein S Ag, Total: 92 % (ref 60–150)

## 2020-02-08 LAB — PROTEIN S ACTIVITY: Protein S Activity: 98 % (ref 63–140)

## 2020-02-08 LAB — PROTEIN C ACTIVITY: Protein C Activity: 192 % — ABNORMAL HIGH (ref 73–180)

## 2020-02-08 MED ORDER — NITROGLYCERIN 0.4 MG SL SUBL: 0.4000 mg | SUBLINGUAL_TABLET | SUBLINGUAL | 3 refills | Status: DC | PRN

## 2020-02-08 MED ORDER — ATORVASTATIN CALCIUM 80 MG PO TABS: 80.0000 mg | ORAL_TABLET | Freq: Every evening | ORAL | 6 refills | Status: DC

## 2020-02-08 MED ORDER — ATORVASTATIN CALCIUM 80 MG PO TABS
80.0000 mg | ORAL_TABLET | Freq: Every day | ORAL | Status: DC
  Administered 2020-02-08: 80 mg via ORAL
  Filled 2020-02-08: qty 1

## 2020-02-08 MED ORDER — ASPIRIN 81 MG PO TBEC: 81.0000 mg | DELAYED_RELEASE_TABLET | Freq: Every day | ORAL | 11 refills | Status: DC

## 2020-02-08 MED ORDER — METOPROLOL SUCCINATE ER 25 MG PO TB24: 25.0000 mg | ORAL_TABLET | Freq: Every day | ORAL | 6 refills | Status: DC

## 2020-02-08 NOTE — Plan of Care (Signed)

## 2020-02-08 NOTE — Progress Notes (Signed)
Discussed discharge instructions with the patient and patient mother's who is at the bedside. Given tonight's atorvastatin to cover medication and patient verbalizes understanding that he needs to take his second dose of brilinta tonight and to [facility] remaining [facility]. Two peripheral IV has been taken out, CCMD has been notified of patient's discharge status and tele monitor has been removed. Patient has no questions.

## 2020-02-08 NOTE — Discharge Summary (Signed)
Discharge Summary    Patient ID: Paul [facility] MRN: [id number]; DOB: 07-24-1991  Admit date: 02/05/2020 Discharge date: 02/08/2020  Primary Care Provider: Cain Saupe, MD  Primary Cardiologist: Paul Harps, MD  Primary Electrophysiologist:  None   Discharge Diagnoses    Active Problems:   Hyperlipidemia with target low density lipoprotein (LDL) cholesterol less than 70 mg/dL   Acute ST elevation myocardial infarction (STEMI) involving left anterior descending (LAD) coronary artery (HCC)   CAD in native artery   Marijuana use   Ischemic cardiomyopathy   Pre-diabetes   Diagnostic Studies/Procedures    2D Echo Complete 02/06/20 IMPRESSIONS    1. Distal septal and apical hypokinesis . Left ventricular ejection  fraction, by estimation, is 50 to 55%. The left ventricle has low normal  function. The left ventricle demonstrates regional wall motion  abnormalities (see scoring diagram/findings for  description). Left ventricular diastolic parameters were normal.  2. Right ventricular systolic function is normal. The right ventricular  size is normal.  3. The mitral valve is normal in structure. Trivial mitral valve  regurgitation. No evidence of mitral stenosis.  4. The aortic valve is tricuspid. Aortic valve regurgitation is not  visualized. No aortic stenosis is present.  5. The inferior vena cava is normal in size with greater than 50%  respiratory variability, suggesting right atrial pressure of 3 mmHg.   [facility] 02/06/20 1. Left ventricular ejection fraction, by estimation, is 60 to 65%. The  left ventricle has normal function. The left ventricle has no regional  wall motion abnormalities.  2. Right ventricular systolic function is normal. The right ventricular  size is normal.  3. No left atrial/left atrial appendage thrombus was detected.  4. Very small PFO as tested with injection of agitated saline with few  bubbles (larger) seen in [place] late. Unable to see  by color doppler.  5. The mitral valve is normal in structure. Trivial mitral valve  regurgitation.  6. The aortic valve is normal in structure. Aortic valve regurgitation is  not visualized.   Cath 02/05/20  Prox LAD lesion is 50% stenosed.  A drug-eluting stent was successfully placed using a SYNERGY XD 4.50X16.  Post intervention, there is a 0% residual stenosis.  The left ventricular systolic function is normal.  LV end diastolic pressure is normal.  The left ventricular ejection fraction is greater than 65% by visual estimate.  There is no mitral valve regurgitation.  Dist LAD lesion is 100% stenosed.  2nd Diag lesion is 90% stenosed.   1. Non-obstructive thrombotic lesion in the proximal LAD with downstream thrombotic occlusion of the apical LAD. IVUS imaging demonstrated thrombus in the proximal LAD. After careful review with the [facility] and imaging team, we decided to proceed with PCI and stenting of the proximal LAD. Successful PTCA/DES x 1 proximal LAD.  2. No obstructive disease in the Circumflex or RCA 3. Hyperdynamic LV systolic function with subtle apical WMA.   Continue DAPT with ASA and Plavix for at least one year. Aggrastat drip for 18 hours. High intensity statin. Will restart beta blocker.    _____________   History of Present Illness     Paul [facility] is a 29 y.o. male with CAD, HLD, marijuana use who presented to [facility] with chest pain. He was seen back in 07/2019 with chest pain and found to have to have [facility]. He underwent cardiac cath with suspected LAD occlusion which was resolving. He was treated with tirofiban for 18 hours post cath. He  was started on ASA, Plavix and statin. Echo showed a normal EF with no WMA. He did not follow up in the clinic afterwards as directed. He only took his meds for about a month.  He presented back to the [facility] with chest pain that started just prior to admission. Reports he was in a verbal altercation with a friend and  developed sharp pain. This felt similar to what he experienced with his prior [facility]. He drove himself to the [facility]. He did report smoking marijuana prior to arrival. In the [facility] his EKG showed SR with ST elevation in inferolateral leads. Code STEMI called in the [facility]. He was brought to the cath lab emergently for further evaluation. Covid test was negative.  [facility] Course     1. STEMI/CAD with ischemic cardiomyopathy and possible PFO vs pulmonary AVM - cath showed non-obstructive thrombotic lesion in the proximal LAD with downstream thrombotic occlusion of the apical LAD. IVUS imaging demonstrated thrombus in the proximal LAD. After careful review with the [facility] and imaging team, the interventionalist team decided to proceed with PCI and stenting of the proximal LAD with DES. He had no obstructive disease in Cx or RCA. LV gram showed hyperdynamic LV systolic function with subtle apical WMA. 2D echo showed distal septal and apical hypokinesis, EF 50-55%, normal RV, otherwise no significant valvular disease. The etiology for his premature CAD is unclear, question familial hyperlipidemia vs hypercoaguable issue. Per [facility] he will need [facility] genetic testing and he will plan to do this as outpatient. [facility] started a work-up for hypercoag state: factor V leiden, prothrombin gene, lupus anticoagulant. ATIII and protein C/S will need to be repeated as drawn on [facility]. [facility] venous duplex was negative. [facility] showed possible very small PFO vs. Pulmonary AVM. Transcranial doppler with bubble study was ordered but not yet performed as inpatient. This will need to be ordered as outpatient when he returns to clinic.  2. HLD, suspected [facility] -LDL 216. There is no strong family history. [facility] would like to send genetic panel when we see him in follow-up for [facility]. Of note, ALT has been mildly elevated (last value 53) so consider rechecking at time of 1 week OV to trend. Unclear if patient was actually taking statin so if the patient  is tolerating statin at time of follow-up appointment, would consider rechecking liver function/lipid panel in 6-8 weeks.   3. Marijuana Use -needs to quit. Cessation advised.  4. Pre-DM - A1c 5.8. Will need lifestyle modification and monitoring as OP.  [facility] has seen and examined the patient today and feels he is stable for discharge. Per [facility], needs 1 week TOC f/u and then appt in 4-6 weeks to see him. Paul [facility] has confirmed he is now his primary cardiologist. I have sent a message to our office's scheduling team requesting these appointments, and our office will call the patient with this information. The weekday team sent in his Brilinta to [facility] pharmacy which was filled to bedside on Friday 4/16. Per d/w care management, they recommended for patient to receive his medications in the [facility] today prior to discharge and pick up remainder at [facility] tomorrow. He only needs his atorvastatin before he goes and the nurse is aware. I did speak to the patient and remind him that he WILL need to remember to take his Brilinta twice a day so his next dose of that will be due this evening. He verbalized understanding. Discharge instructions  as outlined below.    Did the patient have an acute coronary syndrome (MI, [facility], STEMI, etc) this admission?:  Yes                               AHA/ACC Clinical Performance & Quality Measures: 1. Aspirin prescribed? - Yes 2. ADP Receptor Inhibitor (Plavix/Clopidogrel, Brilinta/Ticagrelor or Effient/Prasugrel) prescribed (includes medically managed patients)? - Yes 3. Beta Blocker prescribed? - Yes 4. High Intensity Statin (Lipitor 40-80mg  or Crestor 20-40mg ) prescribed? - Yes 5. EF assessed during THIS hospitalization? - Yes 6. For EF <40%, was ACEI/ARB prescribed? - Not Applicable (EF >/= 40%) 7. For EF <40%, Aldosterone Antagonist (Spironolactone or Eplerenone) prescribed? - Not Applicable (EF >/= 40%) 8. Cardiac Rehab  Phase II ordered (Included Medically managed Patients)? - Yes   _____________  Discharge Vitals Blood pressure 130/89, pulse 85, temperature 97.8 F (36.6 C), temperature source Oral, resp. rate 18, height 5\' 6"  (1.676 m), weight 81 kg, SpO2 98 %.  Filed Weights   02/06/20 0936 02/07/20 0057 02/08/20 0403  Weight: 81.6 kg 82.1 kg 81 kg   Paul Chandler'Neal plans to add discharge exam to his attestation, see below.  Labs & Radiologic Studies    CBC Recent Labs    02/05/20 1312 02/05/20 1312 02/05/20 1353 02/06/20 0223  WBC 7.4  --   --  9.8  NEUTROABS 4.5  --   --   --   HGB 14.6   < > 14.6 13.6  HCT 46.4   < > 43.0 41.7  MCV 89.2  --   --  87.4  PLT 330  --   --  282   < > = values in this interval not displayed.   Basic Metabolic Panel Recent Labs    16/07/9603/17/21 0741 02/08/20 0352  NA 138 137  K 4.6 4.5  CL 103 103  CO2 24 25  GLUCOSE 99 100*  BUN 16 17  CREATININE 1.22 1.18  CALCIUM 9.9 10.1   Liver Function Tests Recent Labs    02/05/20 1312 02/06/20 0223  AST 37 41  ALT 64* 53*  ALKPHOS 67 57  BILITOT 1.2 0.6  PROT 7.4 6.4*  ALBUMIN 4.6 3.8   No results for input(s): LIPASE, AMYLASE in the last 72 hours. High Sensitivity Troponin:   Recent Labs  Lab 02/05/20 1312 02/05/20 1712 02/05/20 1841 02/05/20 2034  TROPONINIHS 6 2,319* 4,612* 8,227*    BNP Invalid input(s): POCBNP D-Dimer No results for input(s): DDIMER in the last 72 hours. Hemoglobin A1C Recent Labs    02/05/20 1312  HGBA1C 5.8*   Fasting Lipid Panel Recent Labs    02/06/20 0223  CHOL 283*  HDL 37*  LDLCALC 216*  TRIG 148  CHOLHDL 7.6   Thyroid Function Tests Recent Labs    02/06/20 0223  TSH 1.568   _____________  CARDIAC CATHETERIZATION  Addendum Date: 02/05/2020    Prox LAD lesion is 50% stenosed.  A drug-eluting stent was successfully placed using a SYNERGY XD 4.50X16.  Post intervention, there is a 0% residual stenosis.  The left ventricular systolic function is  normal.  LV end diastolic pressure is normal.  The left ventricular ejection fraction is greater than 65% by visual estimate.  There is no mitral valve regurgitation.  Dist LAD lesion is 100% stenosed.  2nd Diag lesion is 90% stenosed.  1. Non-obstructive thrombotic lesion in the proximal LAD with downstream thrombotic occlusion  of the apical LAD. IVUS imaging demonstrated thrombus in the proximal LAD. After careful review with the [facility] and imaging team, we decided to proceed with PCI and stenting of the proximal LAD. Successful PTCA/DES x 1 proximal LAD. 2. No obstructive disease in the Circumflex or RCA 3. Hyperdynamic LV systolic function with subtle apical WMA. Continue DAPT with ASA and Plavix for at least one year. Aggrastat drip for 18 hours. High intensity statin. Will restart beta blocker.   Result Date: 02/05/2020  Prox LAD lesion is 50% stenosed.  A drug-eluting stent was successfully placed using a SYNERGY XD 4.50X16.  Post intervention, there is a 0% residual stenosis.  The left ventricular systolic function is normal.  LV end diastolic pressure is normal.  The left ventricular ejection fraction is greater than 65% by visual estimate.  There is no mitral valve regurgitation.  1. Non-obstructive thrombotic lesion in the proximal LAD with downstream thrombotic occlusion of the apical LAD. IVUS imaging demonstrated thrombus in the proximal LAD. After careful review with the [facility] and imaging team, we decided to proceed with PCI and stenting of the proximal LAD. Successful PTCA/DES x 1 proximal LAD. 2. No obstructive disease in the Circumflex or RCA 3. Hyperdynamic LV systolic function with subtle apical WMA. Continue DAPT with ASA and Plavix for at least one year. Aggrastat drip for 18 hours. High intensity statin. Will restart beta blocker.   ECHOCARDIOGRAM COMPLETE  Result Date: 02/06/2020    ECHOCARDIOGRAM REPORT   Patient Name:   Paul [facility] Date of Exam: 02/06/2020 Medical Rec #:   [id number]      Height:       66.0 in Accession #:    [id number]     Weight:       180.0 lb Date of Birth:  12-25-1990      BSA:          1.912 m Patient Age:    28 years       BP:           117/48 mmHg Patient Gender: M              HR:           70 bpm. Exam Location:  Inpatient Procedure: 2D Echo, Cardiac Doppler and Color Doppler Indications:    CAD of Native Vessel 414.01 / I25.10  History:        Patient has prior history of Echocardiogram examinations, most                 recent 02/06/2020. Previous Myocardial Infarction; Risk                 Factors:Dyslipidemia.  Sonographer:    Elmarie Shiley Dance Referring Phys: [id number] CHRISTOPHER D MCALHANY IMPRESSIONS  1. Distal septal and apical hypokinesis . Left ventricular ejection fraction, by estimation, is 50 to 55%. The left ventricle has low normal function. The left ventricle demonstrates regional wall motion abnormalities (see scoring diagram/findings for description). Left ventricular diastolic parameters were normal.  2. Right ventricular systolic function is normal. The right ventricular size is normal.  3. The mitral valve is normal in structure. Trivial mitral valve regurgitation. No evidence of mitral stenosis.  4. The aortic valve is tricuspid. Aortic valve regurgitation is not visualized. No aortic stenosis is present.  5. The inferior vena cava is normal in size with greater than 50% respiratory variability, suggesting right atrial pressure of 3 mmHg. FINDINGS  Left Ventricle: Distal septal and  apical hypokinesis. Left ventricular ejection fraction, by estimation, is 50 to 55%. The left ventricle has low normal function. The left ventricle demonstrates regional wall motion abnormalities. The left ventricular internal cavity size was normal in size. There is no left ventricular hypertrophy. Left ventricular diastolic parameters were normal. Right Ventricle: The right ventricular size is normal. No increase in right ventricular wall thickness. Right ventricular  systolic function is normal. Left Atrium: Left atrial size was normal in size. Right Atrium: Right atrial size was normal in size. Pericardium: Trivial pericardial effusion is present. Mitral Valve: The mitral valve is normal in structure. There is mild thickening of the mitral valve leaflet(s). Normal mobility of the mitral valve leaflets. Trivial mitral valve regurgitation. No evidence of mitral valve stenosis. Tricuspid Valve: The tricuspid valve is normal in structure. Tricuspid valve regurgitation is trivial. No evidence of tricuspid stenosis. Aortic Valve: The aortic valve is tricuspid. Aortic valve regurgitation is not visualized. No aortic stenosis is present. Pulmonic Valve: The pulmonic valve was normal in structure. Pulmonic valve regurgitation is not visualized. No evidence of pulmonic stenosis. Aorta: The aortic root is normal in size and structure. Venous: The inferior vena cava is normal in size with greater than 50% respiratory variability, suggesting right atrial pressure of 3 mmHg. IAS/Shunts: No atrial level shunt detected by color flow Doppler.  LEFT VENTRICLE PLAX 2D LVIDd:         5.00 cm  Diastology LVIDs:         3.20 cm  LV e' lateral:   13.70 cm/s LV PW:         0.80 cm  LV E/e' lateral: 6.4 LV IVS:        1.00 cm  LV e' medial:    7.51 cm/s LVOT diam:     2.00 cm  LV E/e' medial:  11.6 LV SV:         39 LV SV Index:   20 LVOT Area:     3.14 cm  RIGHT VENTRICLE             IVC RV Basal diam:  2.50 cm     IVC diam: 1.90 cm RV S prime:     13.10 cm/s TAPSE (M-mode): 2.2 cm LEFT ATRIUM             Index       RIGHT ATRIUM           Index LA diam:        3.40 cm 1.78 cm/m  RA Area:     13.20 cm LA Vol (A2C):   59.7 ml 31.22 ml/m RA Volume:   30.80 ml  16.11 ml/m LA Vol (A4C):   39.1 ml 20.45 ml/m LA Biplane Vol: 50.5 ml 26.41 ml/m  AORTIC VALVE LVOT Vmax:   55.50 cm/s LVOT Vmean:  38.100 cm/s LVOT VTI:    0.124 m  AORTA Ao Root diam: 3.10 cm Ao Asc diam:  2.60 cm MITRAL VALVE MV Area  (PHT): 3.65 cm    SHUNTS MV Decel Time: 208 msec    Systemic VTI:  0.12 m MV E velocity: 87.00 cm/s  Systemic Diam: 2.00 cm MV A velocity: 51.40 cm/s MV E/A ratio:  1.69 Charlton Haws MD Electronically signed by Charlton Haws MD Signature Date/Time: 02/06/2020/12:58:19 PM    Final    ECHO [facility]  Result Date: 02/06/2020    TRANSESOPHOGEAL ECHO REPORT   Patient Name:   Paul Chandler Date of Exam: 02/06/2020 Medical  Rec #:  [id number]      Height:       66.0 in Accession #:    [id number]     Weight:       180.0 lb Date of Birth:  08-19-1991      BSA:          1.912 m Patient Age:    28 years       BP:           116/60 mmHg Patient Gender: M              HR:           87 bpm. Exam Location:  Inpatient Procedure: 2D Echo Indications:     Non-STEMI (non-ST elevated myocardial infarction)  History:         Patient has prior history of Echocardiogram examinations, most                  recent 08/23/2019. Non-STEMI (non-ST elevated myocardial                  infarction); Risk Factors:Dyslipidemia.  Sonographer:     Leeroy Bock Turrentine Referring Phys:  [id number] Ernest Haber ROBERTS Diagnosing Phys: Dietrich Pates MD PROCEDURE: The transesophogeal probe was passed without difficulty through the esophogus of the patient. Sedation performed by different physician. The patient was monitored while under deep sedation. Anesthestetic sedation was provided intravenously by Anesthesiology: 407mg  of Propofol, 100mg  of Lidocaine. The patient developed no complications during the procedure. IMPRESSIONS  1. Left ventricular ejection fraction, by estimation, is 60 to 65%. The left ventricle has normal function. The left ventricle has no regional wall motion abnormalities.  2. Right ventricular systolic function is normal. The right ventricular size is normal.  3. No left atrial/left atrial appendage thrombus was detected.  4. Very small PFO as tested with injection of agitated saline with few bubbles (larger) seen in LA late. Unable to see by color  doppler.  5. The mitral valve is normal in structure. Trivial mitral valve regurgitation.  6. The aortic valve is normal in structure. Aortic valve regurgitation is not visualized. FINDINGS  Left Ventricle: Left ventricular ejection fraction, by estimation, is 60 to 65%. The left ventricle has normal function. The left ventricle has no regional wall motion abnormalities. The left ventricular internal cavity size was normal in size. There is  no left ventricular hypertrophy. Right Ventricle: The right ventricular size is normal. No increase in right ventricular wall thickness. Right ventricular systolic function is normal. Left Atrium: Left atrial size was normal in size. No left atrial/left atrial appendage thrombus was detected. Right Atrium: Right atrial size was normal in size. Pericardium: There is no evidence of pericardial effusion. Mitral Valve: The mitral valve is normal in structure. Trivial mitral valve regurgitation. Tricuspid Valve: The tricuspid valve is normal in structure. Tricuspid valve regurgitation is mild. Aortic Valve: The aortic valve is normal in structure. Aortic valve regurgitation is not visualized. Pulmonic Valve: The pulmonic valve was normal in structure. Pulmonic valve regurgitation is not visualized. Aorta: The aortic root and ascending aorta are structurally normal, with no evidence of dilitation. IAS/Shunts: Evidence of atrial level shunting detected by color flow Doppler. Agitated saline contrast was given intravenously to evaluate for intracardiac shunting. Agitated saline contrast bubble study was positive with shunting observed within 3-6 cardiac cycles suggestive of interatrial shunt. A small patent foramen ovale is detected. Very small PFO as tested with injection of agitated saline with few bubbles (  larger) seen in [place] late. Unable to see by color doppler. Dorris Carnes MD Electronically signed by Dorris Carnes MD Signature Date/Time: 02/06/2020/5:37:24 PM    Final    VAS [place] LOWER  EXTREMITY VENOUS (DVT)  Result Date: 02/06/2020  Lower Venous DVTStudy Indications: Edema.  Limitations: Poor ultrasound/tissue interface. Comparison Study: No prior study Performing Technologist: Maudry Mayhew MHA, RDMS, RVT, RDCS  Examination Guidelines: A complete evaluation includes B-mode imaging, spectral Doppler, color Doppler, and power Doppler as needed of all accessible portions of each vessel. Bilateral testing is considered an integral part of a complete examination. Limited examinations for reoccurring indications may be performed as noted. The reflux portion of the exam is performed with the patient in reverse Trendelenburg.  +---------+---------------+---------+-----------+----------+--------------+ RIGHT    CompressibilityPhasicitySpontaneityPropertiesThrombus Aging +---------+---------------+---------+-----------+----------+--------------+ CFV      Full           Yes      Yes                                 +---------+---------------+---------+-----------+----------+--------------+ SFJ      Full                                                        +---------+---------------+---------+-----------+----------+--------------+ FV Prox  Full                                                        +---------+---------------+---------+-----------+----------+--------------+ FV Mid   Full                                                        +---------+---------------+---------+-----------+----------+--------------+ FV DistalFull                                                        +---------+---------------+---------+-----------+----------+--------------+ PFV      Full                                                        +---------+---------------+---------+-----------+----------+--------------+ POP      Full           Yes      Yes                                 +---------+---------------+---------+-----------+----------+--------------+  PTV      Full                                                        +---------+---------------+---------+-----------+----------+--------------+  PERO     Full                                                        +---------+---------------+---------+-----------+----------+--------------+   +---------+---------------+---------+-----------+----------+--------------+ LEFT     CompressibilityPhasicitySpontaneityPropertiesThrombus Aging +---------+---------------+---------+-----------+----------+--------------+ CFV      Full           Yes      Yes                                 +---------+---------------+---------+-----------+----------+--------------+ SFJ      Full                                                        +---------+---------------+---------+-----------+----------+--------------+ FV Prox  Full                                                        +---------+---------------+---------+-----------+----------+--------------+ FV Mid   Full                                                        +---------+---------------+---------+-----------+----------+--------------+ FV DistalFull                                                        +---------+---------------+---------+-----------+----------+--------------+ PFV      Full                                                        +---------+---------------+---------+-----------+----------+--------------+ POP      Full           Yes      Yes                                 +---------+---------------+---------+-----------+----------+--------------+ PTV      Full                                                        +---------+---------------+---------+-----------+----------+--------------+ PERO     Full                                                        +---------+---------------+---------+-----------+----------+--------------+  Summary: RIGHT: - There is no evidence of  deep vein thrombosis in the lower extremity.  - No cystic structure found in the popliteal fossa.  LEFT: - There is no evidence of deep vein thrombosis in the lower extremity.  - No cystic structure found in the popliteal fossa.  *See table(s) above for measurements and observations. Electronically signed by Sherald Hess MD on 02/06/2020 at 4:02:42 PM.    Final    Disposition   Pt is being discharged home today in good condition.  Follow-up Plans & Appointments    Follow-up Information    O'Neal, Ronnald Ramp, MD Follow up.   Specialties: Internal Medicine, Cardiology, Radiology Why: [facility] - Our office will call you for a follow-up appointment. Please call the office if you have not heard from [place] within 3 days. Contact information: [address] [place] [address] [phone]          Discharge Instructions    Amb Referral to Cardiac Rehabilitation   Complete by: As directed    Diagnosis:  Coronary Stents PTCA STEMI     After initial evaluation and assessments completed: Virtual Based Care may be provided alone or in conjunction with Phase 2 Cardiac Rehab based on patient barriers.: Yes   Diet - low sodium heart healthy   Complete by: As directed    Discharge instructions   Complete by: As directed    Your Brilinta (ticagrelor) blood thinner was delivered to your bedside before you left the [facility]. Do NOT forget to take this medication twice a day. Your next dose is due tonight!  Your other medications were sent to the [facility] and [facility] (aspirin, atorvastatin, metoprolol, nitroglycerin). You can pick these up tomorrow when they re-open. You will no longer take Plavix/clopidogrel.   Increase activity slowly   Complete by: As directed    No driving for 2 weeks. No lifting over 10 lbs for 4 weeks. No sexual activity for 4 weeks. You may not return to work until cleared by your cardiologist. Keep procedure site clean & dry. If you notice  increased pain, swelling, bleeding or pus, call/return!  You may shower, but no soaking baths/hot tubs/pools for 1 week.      Discharge Medications   Allergies as of 02/08/2020   No Known Allergies     Medication List    STOP taking these medications   clopidogrel 75 MG tablet Commonly known as: PLAVIX     TAKE these medications   aspirin 81 MG EC tablet Take 1 tablet (81 mg total) by mouth daily.   atorvastatin 80 MG tablet Commonly known as: LIPITOR Take 1 tablet (80 mg total) by mouth every evening. What changed:   medication strength  how much to take  how to take this  when to take this  additional instructions   metoprolol succinate 25 MG 24 hr tablet Commonly known as: TOPROL-XL Take 1 tablet (25 mg total) by mouth daily. Take with or immediately following a meal.   nitroGLYCERIN 0.4 MG SL tablet Commonly known as: NITROSTAT Place 1 tablet (0.4 mg total) under the tongue every 5 (five) minutes as needed for chest pain (up to 3 doses). What changed: reasons to take this   ticagrelor 90 MG Tabs tablet Commonly known as: BRILINTA Take 1 tablet (90 mg total) by mouth 2 (two) times daily.          Outstanding Labs/Studies   As outlined in summary above - will need genetic  testing for [facility], transcranial dopplers arranged and if the patient is tolerating statin at time of follow-up appointment, would consider rechecking liver function/lipid panel in 6-8 weeks.  Duration of Discharge Encounter   Greater than 30 minutes including physician time.  Signed, Laurann Montana, PA-C 02/08/2020, 11:09 AM

## 2020-02-09 ENCOUNTER — Encounter: Payer: Self-pay | Admitting: *Deleted

## 2020-02-09 ENCOUNTER — Telehealth: Payer: Self-pay

## 2020-02-09 MED FILL — NITROGLYCERIN 0.4 MG TAB SL: 0.4 | 10 days supply | Qty: 25 | Fill #0

## 2020-02-09 MED FILL — METOPROLOL SUCCINATE ER 25: 25 | 30 days supply | Qty: 30 | Fill #0

## 2020-02-09 MED FILL — ATORVASTATIN 80 MG TABLET: 80 | 30 days supply | Qty: 30 | Fill #0

## 2020-02-09 NOTE — Telephone Encounter (Signed)
Transition Care Management Follow-up Telephone Call  Date of discharge and from where: 02/08/2020, [facility]   How have you been since you were released from the hospital? He said hat he is feeling pretty good.  He explained that last night he felt like his heart was racing but he fell asleep and feels better today.   Any questions or concerns? none reported at this time  Items Reviewed:  Did the pt receive and understand the discharge instructions provided?  he said that he has the instructions and has no questions   Medications obtained and verified? he stated that he picked up his medications this morning at [facility] pharmacy.  He has all meds except the aspirin. Reviewed medication list and he had no questions at this time.   Any new allergies since your discharge?  none reported   Do you have support at home? yes, his mother  Other (ie: DME, Home Health, etc) no home health or DME ordered.  Has been referred to cardiac rehab  Functional Questionnaire: (I = Independent and D = Dependent) ADL's: independent  Follow up appointments reviewed:    PCP Hospital f/u appt confirmed?scheduled appointment with Dr Jillyn Hidden 02/20/2020 @ 105  [facility] f/u appt confirmed? .he said that he was instructed to call cardiology to schedule an appointment if he doesn't hear from them in 3 days.   Are transportation arrangements needed?  no  If their condition worsens, is the pt aware to call  their PCP or go to the ED?  yes  Was the patient provided with contact information for the PCP's office or ED?yes   Was the pt encouraged to call back with questions or concerns?  yes

## 2020-02-10 LAB — PROTHROMBIN GENE MUTATION

## 2020-02-10 LAB — FACTOR 5 LEIDEN

## 2020-02-11 ENCOUNTER — Telehealth (HOSPITAL_COMMUNITY): Payer: Self-pay

## 2020-02-11 NOTE — Telephone Encounter (Signed)
Called patient to see if he is interested in the Cardiac Rehab Program. Patient expressed interest in the virtual cardiac rehab program. Explained scheduling process and went over insurance, patient verbalized understanding. Will contact patient for scheduling once f/u has been completed.

## 2020-02-12 LAB — HOMOCYSTEINE: Homocysteine: 9.1 umol/L (ref 0.0–14.5)

## 2020-02-13 ENCOUNTER — Telehealth: Payer: Self-pay | Admitting: Physician Assistant

## 2020-02-13 NOTE — Telephone Encounter (Signed)
Patient contacted regarding discharge from [facility] on 02/08/20.    Patient understands to follow up with provider Micah Flesher PA on 4/27 at 2:15 pm at [facility]  Patient understands discharge instructions? yes  Patient understands medications and regiment? yes  Patient understands to bring all medications to this visit? yes

## 2020-02-13 NOTE — Telephone Encounter (Signed)
-----   Message from Lorelle Formosa Via, LPN sent at 4/81/8590  9:49 AM EDT ----- Looks like the pt is scheduled to see Micah Flesher, PA-c at our [facility] office on 4/27 @ 2:15. Will forward message to that office. Thanks! ----- Message ----- From: Marcelino Duster, PA Sent: 02/06/2020  10:30 AM EDT To: [facility] Scheduling, [facility]  Please arrange TOC follow up with Dr. Harvie Bridge team in 7-10 days.   Thanks Angie
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
## 2020-02-17 ENCOUNTER — Ambulatory Visit: Payer: Medicaid Other | Admitting: Physician Assistant

## 2020-02-19 ENCOUNTER — Ambulatory Visit: Payer: Self-pay | Attending: Family Medicine | Admitting: Family Medicine

## 2020-02-19 ENCOUNTER — Encounter: Payer: Self-pay | Admitting: Family Medicine

## 2020-02-19 ENCOUNTER — Other Ambulatory Visit: Payer: Self-pay

## 2020-02-19 VITALS — BP 129/86 | HR 85 | Temp 98.2°F | Ht 66.0 in | Wt 180.4 lb

## 2020-02-19 DIAGNOSIS — I252 Old myocardial infarction: Secondary | ICD-10-CM | POA: Insufficient documentation

## 2020-02-19 DIAGNOSIS — Z7902 Long term (current) use of antithrombotics/antiplatelets: Secondary | ICD-10-CM

## 2020-02-19 DIAGNOSIS — I255 Ischemic cardiomyopathy: Secondary | ICD-10-CM

## 2020-02-19 DIAGNOSIS — I214 Non-ST elevation (NSTEMI) myocardial infarction: Secondary | ICD-10-CM

## 2020-02-19 DIAGNOSIS — E785 Hyperlipidemia, unspecified: Secondary | ICD-10-CM

## 2020-02-19 DIAGNOSIS — Z09 Encounter for follow-up examination after completed treatment for conditions other than malignant neoplasm: Secondary | ICD-10-CM

## 2020-02-19 DIAGNOSIS — R7303 Prediabetes: Secondary | ICD-10-CM

## 2020-02-19 DIAGNOSIS — Z955 Presence of coronary angioplasty implant and graft: Secondary | ICD-10-CM | POA: Insufficient documentation

## 2020-02-19 DIAGNOSIS — Z7984 Long term (current) use of oral hypoglycemic drugs: Secondary | ICD-10-CM | POA: Insufficient documentation

## 2020-02-19 DIAGNOSIS — I2510 Atherosclerotic heart disease of native coronary artery without angina pectoris: Secondary | ICD-10-CM | POA: Insufficient documentation

## 2020-02-19 DIAGNOSIS — Z7982 Long term (current) use of aspirin: Secondary | ICD-10-CM | POA: Insufficient documentation

## 2020-02-19 DIAGNOSIS — Z8249 Family history of ischemic heart disease and other diseases of the circulatory system: Secondary | ICD-10-CM | POA: Insufficient documentation

## 2020-02-19 DIAGNOSIS — Z79899 Other long term (current) drug therapy: Secondary | ICD-10-CM

## 2020-02-19 MED ORDER — METOPROLOL SUCCINATE ER 25 MG PO TB24: 25.0000 mg | ORAL_TABLET | Freq: Every day | ORAL | 6 refills | Status: DC

## 2020-02-19 MED ORDER — ATORVASTATIN CALCIUM 80 MG PO TABS: 80.0000 mg | ORAL_TABLET | Freq: Every evening | ORAL | 6 refills | Status: DC

## 2020-02-19 MED ORDER — ASPIRIN 81 MG PO TBEC: 81.0000 mg | DELAYED_RELEASE_TABLET | Freq: Every day | ORAL | 11 refills | Status: DC

## 2020-02-19 MED ORDER — METFORMIN HCL 500 MG PO TABS: ORAL_TABLET | ORAL | 3 refills | Status: DC

## 2020-02-19 MED ORDER — TICAGRELOR 90 MG PO TABS: 90.0000 mg | ORAL_TABLET | Freq: Two times a day (BID) | ORAL | 1 refills | Status: DC

## 2020-02-19 MED FILL — METFORMIN HCL 500 MG TABS: 500 | 30 days supply | Qty: 30 | Fill #0
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
## 2020-02-19 NOTE — Progress Notes (Signed)
HFU  dont have any chest pain

## 2020-02-19 NOTE — Patient Instructions (Signed)

## 2020-02-20 LAB — COMPREHENSIVE METABOLIC PANEL WITH GFR
ALT: 68 IU/L — ABNORMAL HIGH (ref 0–44)
AST: 29 IU/L (ref 0–40)
Albumin/Globulin Ratio: 2 (ref 1.2–2.2)
Albumin: 4.8 g/dL (ref 4.1–5.2)
Alkaline Phosphatase: 94 IU/L (ref 39–117)
BUN/Creatinine Ratio: 11 (ref 9–20)
BUN: 13 mg/dL (ref 6–20)
Bilirubin Total: 0.4 mg/dL (ref 0.0–1.2)
CO2: 23 mmol/L (ref 20–29)
Calcium: 10.2 mg/dL (ref 8.7–10.2)
Chloride: 104 mmol/L (ref 96–106)
Creatinine, Ser: 1.16 mg/dL (ref 0.76–1.27)
GFR calc Af Amer: 99 mL/min/1.73
GFR calc non Af Amer: 86 mL/min/1.73
Globulin, Total: 2.4 g/dL (ref 1.5–4.5)
Glucose: 84 mg/dL (ref 65–99)
Potassium: 4.4 mmol/L (ref 3.5–5.2)
Sodium: 141 mmol/L (ref 134–144)
Total Protein: 7.2 g/dL (ref 6.0–8.5)

## 2020-02-20 LAB — CBC
Hematocrit: 43.7 % (ref 37.5–51.0)
Hemoglobin: 14.6 g/dL (ref 13.0–17.7)
MCH: 29.1 pg (ref 26.6–33.0)
MCHC: 33.4 g/dL (ref 31.5–35.7)
MCV: 87 fL (ref 79–97)
Platelets: 400 x10E3/uL (ref 150–450)
RBC: 5.01 x10E6/uL (ref 4.14–5.80)
RDW: 13 % (ref 11.6–15.4)
WBC: 6 x10E3/uL (ref 3.4–10.8)
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
## 2020-03-03 ENCOUNTER — Ambulatory Visit (INDEPENDENT_AMBULATORY_CARE_PROVIDER_SITE_OTHER): Payer: Self-pay | Admitting: Physician Assistant

## 2020-03-03 ENCOUNTER — Encounter: Payer: Self-pay | Admitting: Physician Assistant

## 2020-03-03 ENCOUNTER — Other Ambulatory Visit: Payer: Self-pay

## 2020-03-03 VITALS — BP 120/78 | HR 76 | Temp 97.3°F | Ht 67.0 in | Wt 189.8 lb

## 2020-03-03 DIAGNOSIS — R7401 Elevation of levels of liver transaminase levels: Secondary | ICD-10-CM

## 2020-03-03 DIAGNOSIS — I2102 ST elevation (STEMI) myocardial infarction involving left anterior descending coronary artery: Secondary | ICD-10-CM

## 2020-03-03 DIAGNOSIS — E785 Hyperlipidemia, unspecified: Secondary | ICD-10-CM

## 2020-03-03 DIAGNOSIS — I214 Non-ST elevation (NSTEMI) myocardial infarction: Secondary | ICD-10-CM

## 2020-03-03 MED ORDER — NITROGLYCERIN 0.4 MG SL SUBL: 0.4000 mg | SUBLINGUAL_TABLET | SUBLINGUAL | 3 refills | Status: DC | PRN

## 2020-03-03 MED ORDER — ASPIRIN 81 MG PO TBEC: 81.0000 mg | DELAYED_RELEASE_TABLET | Freq: Every day | ORAL | 11 refills | Status: DC

## 2020-03-03 MED ORDER — CLOPIDOGREL BISULFATE 75 MG PO TABS: ORAL_TABLET | ORAL | 0 refills | Status: DC

## 2020-03-03 MED ORDER — ISOSORBIDE MONONITRATE ER 30 MG PO TB24: 15.0000 mg | ORAL_TABLET | Freq: Every day | ORAL | 6 refills | Status: DC

## 2020-03-03 MED ORDER — METOPROLOL SUCCINATE ER 25 MG PO TB24: 25.0000 mg | ORAL_TABLET | Freq: Every day | ORAL | 6 refills | Status: DC

## 2020-03-03 MED ORDER — CLOPIDOGREL BISULFATE 75 MG PO TABS: 75.0000 mg | ORAL_TABLET | Freq: Every day | ORAL | 6 refills | Status: DC

## 2020-03-03 MED ORDER — TICAGRELOR 90 MG PO TABS: 90.0000 mg | ORAL_TABLET | Freq: Two times a day (BID) | ORAL | 6 refills | Status: DC

## 2020-03-03 MED ORDER — ATORVASTATIN CALCIUM 80 MG PO TABS: 80.0000 mg | ORAL_TABLET | Freq: Every evening | ORAL | 6 refills | Status: DC

## 2020-03-03 NOTE — Patient Instructions (Addendum)
Medication Instructions:   START Clopidogrel (Plavix) 75 mg---take 4 tablets (300 mg) on the first day. Take 1 tablet (75 mg) daily thereafter.  START Isosorbide (IMDUR) 30 mg---take 1/2 tablet (15 mg) daily.  *If you need a refill on your cardiac medications before your next appointment, please call your pharmacy*   Labwork:  Your physician recommends that you return for lab work today: Liver function panel   Follow-Up: At [facility], you and your health needs are our priority.  As part of our continuing mission to provide you with exceptional heart care, we have created designated Provider Care Teams.  These Care Teams include your primary Cardiologist (physician) and Advanced Practice Providers (APPs -  Physician Assistants and Nurse Practitioners) who all work together to provide you with the care you need, when you need it.  We recommend signing up for the patient portal called "MyChart".  Sign up information is provided on this After Visit Summary.  MyChart is used to connect with patients for Virtual Visits (Telemedicine).  Patients are able to view lab/test results, encounter notes, upcoming appointments, etc.  Non-urgent messages can be sent to your provider as well.   To learn more about what you can do with MyChart, go to [email or web address].    Your next appointment:   2 month(s)  The format for your next appointment:   In Person  Provider:   Lennie Odor, MD

## 2020-03-04 ENCOUNTER — Telehealth: Payer: Self-pay | Admitting: Physician Assistant

## 2020-03-04 ENCOUNTER — Other Ambulatory Visit: Payer: Self-pay | Admitting: Physician Assistant

## 2020-03-04 DIAGNOSIS — I2102 ST elevation (STEMI) myocardial infarction involving left anterior descending coronary artery: Secondary | ICD-10-CM

## 2020-03-04 LAB — HEPATIC FUNCTION PANEL
ALT: 97 IU/L — ABNORMAL HIGH (ref 0–44)
AST: 39 IU/L (ref 0–40)
Albumin: 4.9 g/dL (ref 4.1–5.2)
Alkaline Phosphatase: 95 IU/L (ref 39–117)
Bilirubin Total: 0.3 mg/dL (ref 0.0–1.2)
Bilirubin, Direct: 0.12 mg/dL (ref 0.00–0.40)
Total Protein: 7.3 g/dL (ref 6.0–8.5)

## 2020-03-04 NOTE — Telephone Encounter (Signed)
    I went in pt's chart to see who called him about his lab results.

## 2020-03-08 NOTE — Telephone Encounter (Signed)
Pt is interested in participating in Virtual Cardiac and Pulmonary Rehab. Pt advised that Virtual Cardiac and Pulmonary Rehab is provided at no cost to the patient.  Checklist:  1. Pt has smart device  ie smartphone and/or ipad for downloading an app  Yes 2. Reliable internet/wifi service    Yes 3. Understands how to use their smartphone and navigate within an app.  Yes   Pt verbalized understanding and is in agreement.  

## 2020-03-08 NOTE — Telephone Encounter (Signed)
Called patient to see if he was interested in participating in the Cardiac/VC Rehab Program. Patient stated yes. Patient will come in for orientation on 04/06/20 @ 2PM and will attend the VCR program only.  Mailed letter.

## 2020-03-11 ENCOUNTER — Telehealth: Payer: Self-pay | Admitting: Cardiovascular Disease

## 2020-03-11 DIAGNOSIS — I214 Non-ST elevation (NSTEMI) myocardial infarction: Secondary | ICD-10-CM

## 2020-03-11 DIAGNOSIS — E785 Hyperlipidemia, unspecified: Secondary | ICD-10-CM

## 2020-03-11 NOTE — Telephone Encounter (Signed)
LMTCB

## 2020-03-11 NOTE — Telephone Encounter (Signed)
New message    *STAT* If patient is at the pharmacy, call can be transferred to refill team.   1. Which medications need to be refilled? (please list name of each medication and dose if known) Patient states that he needs a refill on all medications on his list   2. Which pharmacy/location (including street and city if local pharmacy) is medication to be sent to?[id number] - [place], [place] - [address]  3. Do they need a 30 day or 90 day supply? 30

## 2020-03-11 NOTE — Telephone Encounter (Signed)
Pt c/o medication issue:  1. Name of Medication: clopidogrel (PLAVIX) 75 MG tablet and ticagrelor (BRILINTA) 90 MG TABS tablet  2. How are you currently taking this medication (dosage and times per day)? N/a   3. Are you having a reaction (difficulty breathing--STAT)? no  4. What is your medication issue? Patient states that he was to be taken off Brilinta and put on Plavix. But, his pharmacy is only getting the refill for Brilinta. Please advise.

## 2020-03-12 NOTE — Telephone Encounter (Signed)
Spoke with pt, his medications have been refilled

## 2020-03-12 NOTE — Telephone Encounter (Signed)
Refill sent to the pharmacy electronically.  

## 2020-03-16 ENCOUNTER — Other Ambulatory Visit: Payer: Self-pay | Admitting: *Deleted

## 2020-03-16 DIAGNOSIS — E785 Hyperlipidemia, unspecified: Secondary | ICD-10-CM

## 2020-03-16 DIAGNOSIS — R945 Abnormal results of liver function studies: Secondary | ICD-10-CM

## 2020-03-25 ENCOUNTER — Ambulatory Visit: Payer: Medicaid Other | Admitting: Cardiovascular Disease

## 2020-04-04 ENCOUNTER — Telehealth (HOSPITAL_COMMUNITY): Payer: Self-pay

## 2020-04-05 ENCOUNTER — Encounter (HOSPITAL_COMMUNITY)
Admission: RE | Admit: 2020-04-05 | Discharge: 2020-04-05 | Disposition: A | Discharge status: Home / Self Care | Payer: Medicaid Other | Source: Ambulatory Visit | Attending: Cardiovascular Disease | Admitting: Cardiovascular Disease

## 2020-04-05 ENCOUNTER — Other Ambulatory Visit: Payer: Self-pay

## 2020-04-05 NOTE — Progress Notes (Signed)
Cardiac Rehab Telephone Note:  Successful telephone encounter to Paul Chandler to confirm Cardiac Rehab orientation appointment for 04/06/20 at 2:00pm. Nursing assessment completed. Patient questions answered. Instructions for appointment provided. Patient screening for Covid-19 negative. Patient will participate in the VCR [facility] App program. Consent obtained (see below). Invitation text message sent to [phone].  Pt is interested in participating in Virtual Cardiac and Pulmonary Rehab. Pt advised that Virtual Cardiac and Pulmonary Rehab is provided at no cost to the patient.  Checklist:  1. Pt has smart device  ie smartphone and/or ipad for downloading an app  Yes 2. Reliable internet/wifi service    Yes 3. Understands how to use their smartphone and navigate within an app.  Yes              Confirm Consent - In the setting of the current Covid19 crisis, you are scheduled for a phone visit with your Cardiac or Pulmonary team member.  Just as we do with many in-gym visits, in order for you to participate in this visit, we must obtain consent.  If you'd like, I can send this to your mychart (if signed up) or email for you to review.  Otherwise, I can obtain your verbal consent now.  By agreeing to a telephone visit, we'd like you to understand that the technology does not allow for your Cardiac or Pulmonary Rehab team member to perform a physical assessment, and thus may limit their ability to fully assess your ability to perform exercise programs. If your provider identifies any concerns that need to be evaluated in person, we will make arrangements to do so.  Finally, though the technology is pretty good, we cannot assure that it will always work on either your or our end and we cannot ensure that we have a secure connection.  Cardiac and Pulmonary Rehab Telehealth visits and "At Home" cardiac and pulmonary rehab are provided at no cost to you.        Are you willing to proceed?"         STAFF: Did the patient verbally acknowledge consent to telehealth visit? Document YES/NO here: Yes         Date 04/05/20 @ Time 10:28          Iver Miklas E. Suzie Portela RN, BSN [place]. [facility]  Cardiac and Pulmonary Rehabilitation Phone: [phone] Fax: [phone]

## 2020-04-06 ENCOUNTER — Inpatient Hospital Stay (HOSPITAL_COMMUNITY): Admission: RE | Admit: 2020-04-06 | Payer: Medicaid Other | Source: Ambulatory Visit

## 2020-04-06 ENCOUNTER — Telehealth (HOSPITAL_COMMUNITY): Payer: Self-pay

## 2020-04-06 NOTE — Telephone Encounter (Signed)
Cardiac Rehab Telephone Note:  Successful telephone encounter to Paul Chandler to follow up on missed cardiac rehab orientation appointment scheduled for today 04/06/20 at 2:00pm. Per Mr. Blue, he thought appointment was at 2:30pm however he would not be able to arrive until 2:45. Mr. Dohrman also admits to smoking mariajuana today. His cardiac rehab orientation appointment is rescheduled for 04/20/20 at 10:00 and patient is instructed to refrain from smoking mariajuana prior to appointment secondary to safety during 6 min walk test. Patient verbalizes understanding.  Sara Keys E. Suzie Portela RN, BSN . [facility]  Cardiac and Pulmonary Rehabilitation Phone: [phone] Fax: [phone]

## 2020-04-12 ENCOUNTER — Ambulatory Visit: Payer: Medicaid Other | Admitting: Cardiovascular Disease

## 2020-04-13 ENCOUNTER — Telehealth (HOSPITAL_COMMUNITY): Payer: Self-pay

## 2020-04-13 NOTE — Telephone Encounter (Signed)
Cardiac Rehab Medication Review by a Pharmacist  Does the patient  feel that his/her medications are working for him/her?  yes  Has the patient been experiencing any side effects to the medications prescribed?  no  Does the patient measure his/her own blood pressure or blood glucose at home?  no   Does the patient have any problems obtaining medications due to transportation or finances?   no  Understanding of regimen: good Understanding of indications: good Potential of compliance: good    Pharmacist comments: Pt states he has not started taking Imdur yet. I see from cardiology's note that they were going to trial 15 mg but not sure if there's room in blood pressure to tolerate. Please follow up with MD if patient is supposed to start taking Imdur and if prescription has been sent to pharmacy.  Lulu Riding, PharmD PGY1 Pharmacy Resident  Please check AMION for all [facility] phone numbers After 10:00 PM, call Main Pharmacy [phone]  04/13/2020 2:55 PM

## 2020-04-19 ENCOUNTER — Telehealth (HOSPITAL_COMMUNITY): Payer: Self-pay

## 2020-04-19 NOTE — Telephone Encounter (Signed)
Cardiac Rehab Telephone Note:  Successful telephone encounter to Paul Chandler to confirm Cardiac Rehab orientation appointment for 04/20/20 at 10:00. Nursing assessment previously completed 04/05/20. Patient denied changes in medical history. Patient questions answered. Instructions for appointment provided. Patient screening for Covid-19 negative. Patient will participate in the [employer] [facility] VCR program only. Previously sent invitation. Patient encouraged to accept invitation and download app prior to tomorrows appointment.   [name] E. Suzie Portela RN, BSN [name]. [facility]  Cardiac and Pulmonary Rehabilitation Phone: [phone] Fax: [phone]

## 2020-04-20 ENCOUNTER — Encounter (HOSPITAL_COMMUNITY): Payer: Medicaid Other

## 2020-04-20 ENCOUNTER — Telehealth (HOSPITAL_COMMUNITY): Payer: Self-pay

## 2020-04-20 ENCOUNTER — Ambulatory Visit: Payer: Self-pay
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
## 2020-04-20 NOTE — Telephone Encounter (Signed)
Cardiac Rehab Note:   Unsuccessful telephone encounter to [employer]. Orren to enquire about no call/no show to scheduled Cardiac Rehab Orientation appointment for today at 10:00. Unable to leave message on provided home number. Father answered patients given mobile number. Hip[employer] compliant VM message left requesting call back. This is the second no call/no show appointment. Of note, patient confirmed todays appointment during reminder call yesterday 04/19/20 (see telephone note).  Shemar Plemmons E. Suzie Portela RN, BSN [place]. [facility]  Cardiac and Pulmonary Rehabilitation Phone: [phone] Fax: [phone]

## 2020-05-12 ENCOUNTER — Encounter (HOSPITAL_COMMUNITY): Payer: Self-pay

## 2020-05-12 ENCOUNTER — Telehealth (HOSPITAL_COMMUNITY): Payer: Self-pay

## 2020-05-12 NOTE — Telephone Encounter (Signed)
Attempted to call patient in regards to Cardiac Rehab - unable to leave VM.   Mailed letter 

## 2020-05-20 ENCOUNTER — Ambulatory Visit: Payer: Medicaid Other | Admitting: Family Medicine
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
## 2020-06-03 ENCOUNTER — Ambulatory Visit: Payer: Self-pay | Admitting: Cardiovascular Disease

## 2020-06-03 ENCOUNTER — Ambulatory Visit: Payer: Self-pay

## 2020-06-08 ENCOUNTER — Telehealth (HOSPITAL_COMMUNITY): Payer: Self-pay

## 2020-06-08 NOTE — Telephone Encounter (Signed)
No response from pt regarding CR.  Closed referral.  

## 2021-01-18 ENCOUNTER — Other Ambulatory Visit: Payer: Self-pay

## 2021-01-18 ENCOUNTER — Inpatient Hospital Stay (HOSPITAL_COMMUNITY)
Admission: EM | Admit: 2021-01-18 | Discharge: 2021-01-20 | DRG: 282 | Disposition: A | Discharge status: Home / Self Care | Payer: Self-pay | Attending: Cardiology | Admitting: Cardiology

## 2021-01-18 ENCOUNTER — Emergency Department (HOSPITAL_COMMUNITY): Payer: Self-pay

## 2021-01-18 DIAGNOSIS — Z20822 Contact with and (suspected) exposure to covid-19: Secondary | ICD-10-CM | POA: Diagnosis present

## 2021-01-18 DIAGNOSIS — I255 Ischemic cardiomyopathy: Secondary | ICD-10-CM | POA: Diagnosis present

## 2021-01-18 DIAGNOSIS — I214 Non-ST elevation (NSTEMI) myocardial infarction: Principal | ICD-10-CM | POA: Diagnosis present

## 2021-01-18 DIAGNOSIS — I2510 Atherosclerotic heart disease of native coronary artery without angina pectoris: Secondary | ICD-10-CM | POA: Diagnosis present

## 2021-01-18 DIAGNOSIS — Z7982 Long term (current) use of aspirin: Secondary | ICD-10-CM

## 2021-01-18 DIAGNOSIS — Z955 Presence of coronary angioplasty implant and graft: Secondary | ICD-10-CM

## 2021-01-18 DIAGNOSIS — E785 Hyperlipidemia, unspecified: Secondary | ICD-10-CM | POA: Diagnosis present

## 2021-01-18 DIAGNOSIS — Z7902 Long term (current) use of antithrombotics/antiplatelets: Secondary | ICD-10-CM

## 2021-01-18 DIAGNOSIS — F1290 Cannabis use, unspecified, uncomplicated: Secondary | ICD-10-CM | POA: Diagnosis present

## 2021-01-18 DIAGNOSIS — F1210 Cannabis abuse, uncomplicated: Secondary | ICD-10-CM | POA: Diagnosis present

## 2021-01-18 DIAGNOSIS — Z7984 Long term (current) use of oral hypoglycemic drugs: Secondary | ICD-10-CM

## 2021-01-18 DIAGNOSIS — R7303 Prediabetes: Secondary | ICD-10-CM | POA: Diagnosis present

## 2021-01-18 DIAGNOSIS — I252 Old myocardial infarction: Secondary | ICD-10-CM

## 2021-01-18 DIAGNOSIS — Z79899 Other long term (current) drug therapy: Secondary | ICD-10-CM

## 2021-01-18 DIAGNOSIS — Z8249 Family history of ischemic heart disease and other diseases of the circulatory system: Secondary | ICD-10-CM

## 2021-01-18 LAB — BASIC METABOLIC PANEL
Anion gap: 7 (ref 5–15)
BUN: 12 mg/dL (ref 6–20)
CO2: 25 mmol/L (ref 22–32)
Calcium: 10 mg/dL (ref 8.9–10.3)
Chloride: 104 mmol/L (ref 98–111)
Creatinine, Ser: 1.13 mg/dL (ref 0.61–1.24)
GFR, Estimated: >60 mL/min (ref >60)
Glucose, Bld: 100 mg/dL — ABNORMAL HIGH (ref 70–99)
Potassium: 4.1 mmol/L (ref 3.5–5.1)
Sodium: 136 mmol/L (ref 135–145)

## 2021-01-18 LAB — CBC
HCT: 48.6 % (ref 39.0–52.0)
Hemoglobin: 15.7 g/dL (ref 13.0–17.0)
MCH: 28.6 pg (ref 26.0–34.0)
MCHC: 32.3 g/dL (ref 30.0–36.0)
MCV: 88.7 fL (ref 80.0–100.0)
Platelets: 366 10*3/uL (ref 150–400)
RBC: 5.48 MIL/uL (ref 4.22–5.81)
RDW: 12.9 % (ref 11.5–15.5)
WBC: 7.7 10*3/uL (ref 4.0–10.5)
nRBC: 0 % (ref 0.0–0.2)

## 2021-01-18 LAB — RESP PANEL BY RT-PCR (FLU A&B, COVID) ARPGX2
Influenza A by PCR: NEGATIVE
Influenza B by PCR: NEGATIVE
SARS Coronavirus 2 by RT PCR: NEGATIVE

## 2021-01-18 LAB — TROPONIN I (HIGH SENSITIVITY)
Troponin I (High Sensitivity): 63 ng/L — ABNORMAL HIGH (ref <18)
Troponin I (High Sensitivity): 153 ng/L (ref <18)

## 2021-01-18 MED ORDER — ASPIRIN 81 MG PO CHEW
324.0000 mg | CHEWABLE_TABLET | Freq: Once | ORAL | Status: AC
  Administered 2021-01-18: 324 mg via ORAL
  Filled 2021-01-18: qty 4

## 2021-01-18 MED ORDER — NITROGLYCERIN 0.4 MG SL SUBL
0.4000 mg | SUBLINGUAL_TABLET | SUBLINGUAL | Status: DC | PRN
  Administered 2021-01-18: 0.4 mg via SUBLINGUAL

## 2021-01-18 MED ORDER — NITROGLYCERIN 0.4 MG SL SUBL
0.4000 mg | SUBLINGUAL_TABLET | SUBLINGUAL | Status: DC | PRN
  Filled 2021-01-18: qty 1

## 2021-01-18 MED ORDER — HEPARIN (PORCINE) 25000 UT/250ML-% IV SOLN
1100.0000 [IU]/h | INTRAVENOUS | Status: DC
  Administered 2021-01-18: 1100 [IU]/h via INTRAVENOUS
  Filled 2021-01-18: qty 250

## 2021-01-18 MED ORDER — HEPARIN BOLUS VIA INFUSION
4000.0000 [IU] | Freq: Once | INTRAVENOUS | Status: AC
  Administered 2021-01-18: 4000 [IU] via INTRAVENOUS
  Filled 2021-01-18: qty 4000

## 2021-01-18 MED ORDER — ONDANSETRON HCL 4 MG/2ML IJ SOLN: 4.0000 mg | Freq: Four times a day (QID) | INTRAMUSCULAR | Status: DC | PRN

## 2021-01-18 MED ORDER — ASPIRIN EC 81 MG PO TBEC: 81.0000 mg | DELAYED_RELEASE_TABLET | Freq: Every day | ORAL | Status: DC
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
## 2021-01-18 NOTE — ED Notes (Signed)
Trop of 153 per lab tech at this time, ED provider to be made aware

## 2021-01-18 NOTE — ED Notes (Signed)
Mother Bard Herbert [phone] would like an update when possbile
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
## 2021-01-19 ENCOUNTER — Observation Stay (HOSPITAL_COMMUNITY): Payer: Self-pay

## 2021-01-19 ENCOUNTER — Encounter (HOSPITAL_COMMUNITY)
Admission: EM | Disposition: A | Discharge status: Home / Self Care | Payer: Self-pay | Source: Home / Self Care | Attending: Cardiology

## 2021-01-19 ENCOUNTER — Encounter (HOSPITAL_COMMUNITY): Payer: Self-pay | Admitting: Cardiology

## 2021-01-19 DIAGNOSIS — I2510 Atherosclerotic heart disease of native coronary artery without angina pectoris: Secondary | ICD-10-CM

## 2021-01-19 DIAGNOSIS — I214 Non-ST elevation (NSTEMI) myocardial infarction: Principal | ICD-10-CM

## 2021-01-19 DIAGNOSIS — Z955 Presence of coronary angioplasty implant and graft: Secondary | ICD-10-CM

## 2021-01-19 DIAGNOSIS — I25110 Atherosclerotic heart disease of native coronary artery with unstable angina pectoris: Secondary | ICD-10-CM

## 2021-01-19 HISTORY — PX: LEFT HEART CATH AND CORONARY ANGIOGRAPHY: CATH118249

## 2021-01-19 LAB — HEPARIN LEVEL (UNFRACTIONATED)
Heparin Unfractionated: 0.44 IU/mL (ref 0.30–0.70)
Heparin Unfractionated: 0.93 IU/mL — ABNORMAL HIGH (ref 0.30–0.70)

## 2021-01-19 LAB — TROPONIN I (HIGH SENSITIVITY)
Troponin I (High Sensitivity): 509 ng/L (ref <18)
Troponin I (High Sensitivity): 3233 ng/L (ref <18)

## 2021-01-19 LAB — ECHOCARDIOGRAM COMPLETE
Area-P 1/2: 7.16 cm2
Calc EF: 63.5 %
Height: 66 in
S' Lateral: 2.8 cm
Single Plane A2C EF: 67.3 %
Single Plane A4C EF: 61.7 %
Weight: 3200 oz

## 2021-01-19 LAB — CREATININE, SERUM
Creatinine, Ser: 1.13 mg/dL (ref 0.61–1.24)
GFR, Estimated: >60 mL/min (ref >60)

## 2021-01-19 LAB — CBC
HCT: 47.4 % (ref 39.0–52.0)
Hemoglobin: 15.3 g/dL (ref 13.0–17.0)
MCH: 28.3 pg (ref 26.0–34.0)
MCHC: 32.3 g/dL (ref 30.0–36.0)
MCV: 87.8 fL (ref 80.0–100.0)
Platelets: 328 10*3/uL (ref 150–400)
RBC: 5.4 MIL/uL (ref 4.22–5.81)
RDW: 13.1 % (ref 11.5–15.5)
WBC: 8 10*3/uL (ref 4.0–10.5)
nRBC: 0 % (ref 0.0–0.2)

## 2021-01-19 LAB — LIPID PANEL
Cholesterol: 285 mg/dL — ABNORMAL HIGH (ref 0–200)
HDL: 40 mg/dL — ABNORMAL LOW (ref >40)
LDL Cholesterol: 227 mg/dL — ABNORMAL HIGH (ref 0–99)
Total CHOL/HDL Ratio: 7.1 RATIO
Triglycerides: 92 mg/dL (ref <150)
VLDL: 18 mg/dL (ref 0–40)

## 2021-01-19 LAB — HEMOGLOBIN A1C
Hgb A1c MFr Bld: 5.9 % — ABNORMAL HIGH (ref 4.8–5.6)
Mean Plasma Glucose: 122.63 mg/dL

## 2021-01-19 SURGERY — LEFT HEART CATH AND CORONARY ANGIOGRAPHY
Anesthesia: LOCAL

## 2021-01-19 MED ORDER — ASPIRIN EC 81 MG PO TBEC
81.0000 mg | DELAYED_RELEASE_TABLET | Freq: Every day | ORAL | Status: DC
Start: 2021-01-19 — End: 2021-01-19

## 2021-01-19 MED ORDER — HEPARIN SODIUM (PORCINE) 1000 UNIT/ML IJ SOLN
INTRAMUSCULAR | Status: AC
  Filled 2021-01-19: qty 1

## 2021-01-19 MED ORDER — METOPROLOL SUCCINATE ER 25 MG PO TB24
25.0000 mg | ORAL_TABLET | Freq: Every day | ORAL | Status: DC
Start: 2021-01-19 — End: 2021-01-20
  Administered 2021-01-19 – 2021-01-20 (×2): 25 mg via ORAL
  Filled 2021-01-19 (×2): qty 1

## 2021-01-19 MED ORDER — FENTANYL CITRATE (PF) 100 MCG/2ML IJ SOLN
INTRAMUSCULAR | Status: DC | PRN
  Administered 2021-01-19: 50 ug via INTRAVENOUS
  Administered 2021-01-19: 25 ug via INTRAVENOUS

## 2021-01-19 MED ORDER — ROSUVASTATIN CALCIUM 20 MG PO TABS: 40.0000 mg | ORAL_TABLET | Freq: Every day | ORAL | Status: DC

## 2021-01-19 MED ORDER — HEPARIN (PORCINE) IN NACL 1000-0.9 UT/500ML-% IV SOLN
INTRAVENOUS | Status: DC | PRN
  Administered 2021-01-19 (×2): 500 mL

## 2021-01-19 MED ORDER — HEPARIN (PORCINE) IN NACL 1000-0.9 UT/500ML-% IV SOLN
INTRAVENOUS | Status: AC
  Filled 2021-01-19: qty 1000

## 2021-01-19 MED ORDER — MIDAZOLAM HCL 2 MG/2ML IJ SOLN
INTRAMUSCULAR | Status: AC
  Filled 2021-01-19: qty 2

## 2021-01-19 MED ORDER — VERAPAMIL HCL 2.5 MG/ML IV SOLN
INTRAVENOUS | Status: DC | PRN
  Administered 2021-01-19: 2 mg via INTRA_ARTERIAL

## 2021-01-19 MED ORDER — ACETAMINOPHEN 325 MG PO TABS: 650.0000 mg | ORAL_TABLET | ORAL | Status: DC | PRN

## 2021-01-19 MED ORDER — HEPARIN SODIUM (PORCINE) 1000 UNIT/ML IJ SOLN
INTRAMUSCULAR | Status: DC | PRN
  Administered 2021-01-19: 4500 [IU] via INTRAVENOUS

## 2021-01-19 MED ORDER — SODIUM CHLORIDE 0.9 % IV SOLN: INTRAVENOUS | Status: AC

## 2021-01-19 MED ORDER — DIAZEPAM 5 MG PO TABS
5.0000 mg | ORAL_TABLET | Freq: Four times a day (QID) | ORAL | Status: DC | PRN
Start: 2021-01-19 — End: 2021-01-20

## 2021-01-19 MED ORDER — VERAPAMIL HCL 2.5 MG/ML IV SOLN
INTRAVENOUS | Status: DC | PRN
  Administered 2021-01-19: 10 mL via INTRA_ARTERIAL

## 2021-01-19 MED ORDER — SODIUM CHLORIDE 0.9 % WEIGHT BASED INFUSION
3.0000 mL/kg/h | INTRAVENOUS | Status: AC
  Administered 2021-01-19: 3 mL/kg/h via INTRAVENOUS

## 2021-01-19 MED ORDER — ONDANSETRON HCL 4 MG/2ML IJ SOLN: 4.0000 mg | Freq: Four times a day (QID) | INTRAMUSCULAR | Status: DC | PRN

## 2021-01-19 MED ORDER — FENTANYL CITRATE (PF) 100 MCG/2ML IJ SOLN
INTRAMUSCULAR | Status: AC
  Filled 2021-01-19: qty 2

## 2021-01-19 MED ORDER — SODIUM CHLORIDE 0.9% FLUSH: 3.0000 mL | INTRAVENOUS | Status: DC | PRN

## 2021-01-19 MED ORDER — MIDAZOLAM HCL 2 MG/2ML IJ SOLN
INTRAMUSCULAR | Status: DC | PRN
  Administered 2021-01-19: 2 mg via INTRAVENOUS
  Administered 2021-01-19: 1 mg via INTRAVENOUS

## 2021-01-19 MED ORDER — SODIUM CHLORIDE 0.9 % WEIGHT BASED INFUSION
1.0000 mL/kg/h | INTRAVENOUS | Status: DC
  Administered 2021-01-19: 1 mL/kg/h via INTRAVENOUS

## 2021-01-19 MED ORDER — AMLODIPINE BESYLATE 2.5 MG PO TABS
2.5000 mg | ORAL_TABLET | Freq: Every day | ORAL | Status: DC
  Administered 2021-01-19 – 2021-01-20 (×2): 2.5 mg via ORAL
  Filled 2021-01-19 (×2): qty 1

## 2021-01-19 MED ORDER — ASPIRIN 81 MG PO CHEW
81.0000 mg | CHEWABLE_TABLET | ORAL | Status: AC
  Administered 2021-01-19: 81 mg via ORAL
  Filled 2021-01-19: qty 1

## 2021-01-19 MED ORDER — ENOXAPARIN SODIUM 40 MG/0.4ML ~~LOC~~ SOLN
40.0000 mg | SUBCUTANEOUS | Status: DC
  Administered 2021-01-20: 40 mg via SUBCUTANEOUS
  Filled 2021-01-19: qty 0.4

## 2021-01-19 MED ORDER — SODIUM CHLORIDE 0.9 % IV SOLN: 250.0000 mL | INTRAVENOUS | Status: DC | PRN

## 2021-01-19 MED ORDER — CLOPIDOGREL BISULFATE 75 MG PO TABS
75.0000 mg | ORAL_TABLET | Freq: Every day | ORAL | Status: DC
  Administered 2021-01-20: 75 mg via ORAL
  Filled 2021-01-19: qty 1

## 2021-01-19 MED ORDER — VERAPAMIL HCL 2.5 MG/ML IV SOLN
INTRAVENOUS | Status: AC
  Filled 2021-01-19: qty 2

## 2021-01-19 MED ORDER — ASPIRIN 81 MG PO CHEW: 81.0000 mg | CHEWABLE_TABLET | Freq: Every day | ORAL | Status: DC

## 2021-01-19 MED ORDER — ASPIRIN 81 MG PO CHEW: 81.0000 mg | CHEWABLE_TABLET | ORAL | Status: DC

## 2021-01-19 MED ORDER — ROSUVASTATIN CALCIUM 20 MG PO TABS
40.0000 mg | ORAL_TABLET | Freq: Every day | ORAL | Status: DC
  Administered 2021-01-19 – 2021-01-20 (×2): 40 mg via ORAL
  Filled 2021-01-19 (×3): qty 2

## 2021-01-19 MED ORDER — HYDRALAZINE HCL 20 MG/ML IJ SOLN: 10.0000 mg | INTRAMUSCULAR | Status: AC | PRN

## 2021-01-19 MED ORDER — LIDOCAINE HCL (PF) 1 % IJ SOLN
INTRAMUSCULAR | Status: DC | PRN
  Administered 2021-01-19: 2 mL

## 2021-01-19 MED ORDER — SODIUM CHLORIDE 0.9% FLUSH: 3.0000 mL | Freq: Two times a day (BID) | INTRAVENOUS | Status: DC

## 2021-01-19 MED ORDER — LABETALOL HCL 5 MG/ML IV SOLN: 10.0000 mg | INTRAVENOUS | Status: AC | PRN

## 2021-01-19 MED ORDER — LIDOCAINE HCL (PF) 1 % IJ SOLN
INTRAMUSCULAR | Status: AC
  Filled 2021-01-19: qty 30

## 2021-01-19 MED ORDER — IOHEXOL 350 MG/ML SOLN
INTRAVENOUS | Status: DC | PRN
  Administered 2021-01-19: 65 mL via INTRA_ARTERIAL

## 2021-01-19 MED ORDER — ASPIRIN EC 81 MG PO TBEC
81.0000 mg | DELAYED_RELEASE_TABLET | Freq: Every day | ORAL | Status: DC
Start: 2021-01-20 — End: 2021-01-20
  Administered 2021-01-20: 81 mg via ORAL
  Filled 2021-01-19: qty 1

## 2021-01-19 SURGICAL SUPPLY — 13 items
CATH INFINITI 5 FR JL3.5 (CATHETERS) ×2 IMPLANT
CATH INFINITI JR4 5F (CATHETERS) ×2 IMPLANT
CATH OPTITORQUE TIG 4.0 5F (CATHETERS) ×2 IMPLANT
CATH VISTA GUIDE 6FR XBLAD3.5 (CATHETERS) ×2 IMPLANT
DEVICE RAD COMP TR BAND LRG (VASCULAR PRODUCTS) ×2 IMPLANT
GLIDESHEATH SLEND SS 6F .021 (SHEATH) ×2 IMPLANT
GUIDEWIRE INQWIRE 1.5J.035X260 (WIRE) ×1 IMPLANT
INQWIRE 1.5J .035X260CM (WIRE) ×2
KIT HEART LEFT (KITS) ×2 IMPLANT
PACK CARDIAC CATHETERIZATION (CUSTOM PROCEDURE TRAY) ×2 IMPLANT
SHEATH PROBE COVER 6X72 (BAG) ×2 IMPLANT
TRANSDUCER W/STOPCOCK (MISCELLANEOUS) ×2 IMPLANT
TUBING CIL FLEX 10 FLL-RA (TUBING) ×2 IMPLANT

## 2021-01-19 NOTE — Progress Notes (Signed)
  Echocardiogram 2D Echocardiogram has been performed.  Paul Chandler 01/19/2021, 2:41 PM

## 2021-01-19 NOTE — Interval H&P Note (Signed)
Cath Lab Visit (complete for each Cath Lab visit)  Clinical Evaluation Leading to the Procedure:   ACS: Yes.    Non-ACS:    Anginal Classification: CCS III  Anti-ischemic medical therapy: No Therapy  Non-Invasive Test Results: No non-invasive testing performed  Prior CABG: No previous CABG      History and Physical Interval Note:  01/19/2021 10:19 AM  Paul Chandler  has presented today for surgery, with the diagnosis of chest pain.  The various methods of treatment have been discussed with the patient and family. After consideration of risks, benefits and other options for treatment, the patient has consented to  Procedure(s): LEFT HEART CATH AND CORONARY ANGIOGRAPHY (N/A) as a surgical intervention.  The patient's history has been reviewed, patient examined, no change in status, stable for surgery.  I have reviewed the patient's chart and labs.  Questions were answered to the patient's satisfaction.     Nicki Guadalajara

## 2021-01-19 NOTE — TOC Benefit Eligibility Note (Signed)
Patient Advocate Encounter  Insurance verification completed.    The patient is uninsured  Paul Chandler, CPhT Pharmacy Patient Advocate Specialist [place] [facility] Stewardship Team Direct Number: [phone]  Fax: [phone]        

## 2021-01-19 NOTE — Progress Notes (Signed)
ANTICOAGULATION CONSULT NOTE Pharmacy Consult for heparin Indication: chest pain/ACS  No Known Allergies  Patient Measurements: Height: 5\' 6"  (167.6 cm) Weight: 90.7 kg (200 lb) IBW/kg (Calculated) : 63.8 Heparin Dosing Weight: 83kg  Vital Signs: Temp: 98.5 F (36.9 C) (03/30 0025) Temp Source: Oral (03/30 0025) BP: 130/96 (03/30 0400) Pulse Rate: 74 (03/30 0400)  Labs: Recent Labs    01/18/21 1803 01/18/21 2004 01/19/21 0000 01/19/21 0257  HGB 15.7  --   --  15.3  HCT 48.6  --   --  47.4  PLT 366  --   --  328  HEPARINUNFRC  --   --   --  0.44  CREATININE 1.13  --   --   --   TROPONINIHS 63* 153* 509*  --     Estimated Creatinine Clearance: 102.7 mL/min (by C-G formula based on SCr of 1.13 mg/dL).  Assessment: 30 y.o. male with chest pain for heparin   Goal of Therapy:  Heparin level 0.3-0.7 units/ml Monitor platelets by anticoagulation protocol: Yes   Plan:  Continue Heparin at current rate   26, PharmD, BCPS  01/19/2021 4:24 AM

## 2021-01-20 ENCOUNTER — Other Ambulatory Visit: Payer: Self-pay | Admitting: Cardiology

## 2021-01-20 DIAGNOSIS — F1290 Cannabis use, unspecified, uncomplicated: Secondary | ICD-10-CM

## 2021-01-20 DIAGNOSIS — E785 Hyperlipidemia, unspecified: Secondary | ICD-10-CM

## 2021-01-20 DIAGNOSIS — I25111 Atherosclerotic heart disease of native coronary artery with angina pectoris with documented spasm: Secondary | ICD-10-CM

## 2021-01-20 LAB — COMPREHENSIVE METABOLIC PANEL
ALT: 33 U/L (ref 0–44)
AST: 26 U/L (ref 15–41)
Albumin: 3.8 g/dL (ref 3.5–5.0)
Alkaline Phosphatase: 58 U/L (ref 38–126)
Anion gap: 6 (ref 5–15)
BUN: 13 mg/dL (ref 6–20)
CO2: 25 mmol/L (ref 22–32)
Calcium: 9.5 mg/dL (ref 8.9–10.3)
Chloride: 104 mmol/L (ref 98–111)
Creatinine, Ser: 1.16 mg/dL (ref 0.61–1.24)
GFR, Estimated: >60 mL/min (ref >60)
Glucose, Bld: 96 mg/dL (ref 70–99)
Potassium: 4.2 mmol/L (ref 3.5–5.1)
Sodium: 135 mmol/L (ref 135–145)
Total Bilirubin: 0.8 mg/dL (ref 0.3–1.2)
Total Protein: 6.4 g/dL — ABNORMAL LOW (ref 6.5–8.1)

## 2021-01-20 LAB — CBC
HCT: 46.5 % (ref 39.0–52.0)
Hemoglobin: 15.2 g/dL (ref 13.0–17.0)
MCH: 28.6 pg (ref 26.0–34.0)
MCHC: 32.7 g/dL (ref 30.0–36.0)
MCV: 87.6 fL (ref 80.0–100.0)
Platelets: 318 10*3/uL (ref 150–400)
RBC: 5.31 MIL/uL (ref 4.22–5.81)
RDW: 12.9 % (ref 11.5–15.5)
WBC: 5.5 10*3/uL (ref 4.0–10.5)
nRBC: 0 % (ref 0.0–0.2)

## 2021-01-20 MED ORDER — CLOPIDOGREL BISULFATE 75 MG PO TABS
75.0000 mg | ORAL_TABLET | Freq: Every day | ORAL | 2 refills | Status: DC
Start: 2021-01-20 — End: 2021-02-02

## 2021-01-20 MED ORDER — AMLODIPINE BESYLATE 2.5 MG PO TABS: 2.5000 mg | ORAL_TABLET | Freq: Every day | ORAL | 0 refills | Status: DC

## 2021-01-20 MED ORDER — METOPROLOL SUCCINATE ER 25 MG PO TB24: 25.0000 mg | ORAL_TABLET | Freq: Every day | ORAL | 1 refills | Status: DC

## 2021-01-20 MED ORDER — NITROGLYCERIN 0.4 MG SL SUBL: 0.4000 mg | SUBLINGUAL_TABLET | SUBLINGUAL | 2 refills | Status: DC | PRN

## 2021-01-20 MED ORDER — ASPIRIN 81 MG PO TBEC: 81.0000 mg | DELAYED_RELEASE_TABLET | Freq: Every day | ORAL | 1 refills | Status: DC

## 2021-01-20 MED ORDER — ROSUVASTATIN CALCIUM 40 MG PO TABS: 40.0000 mg | ORAL_TABLET | Freq: Every day | ORAL | 1 refills | Status: DC

## 2021-01-20 MED FILL — ROSUVASTATIN CALCIUM 40 MG: 40 | 30 days supply | Qty: 30 | Fill #0

## 2021-01-20 MED FILL — AMLODIPINE BESYLATE 2.5 MG: 2.5 | 30 days supply | Qty: 30 | Fill #0

## 2021-01-20 MED FILL — NITROGLYCERIN 0.4 MG TAB SL: 0.4 | 8 days supply | Qty: 25 | Fill #0

## 2021-01-20 MED FILL — ASPIRIN LOW DOSE 81 MG TBEC: 81 | 90 days supply | Qty: 90 | Fill #0

## 2021-01-20 MED FILL — METOPROLOL SUCCINATE ER 25: 25 | 30 days supply | Qty: 30 | Fill #0

## 2021-01-20 MED FILL — CLOPIDOGREL 75 MG TABLET: 75 | 30 days supply | Qty: 30 | Fill #0

## 2021-01-20 NOTE — Care Management (Signed)
[id number] 01-20-21 Case Manager received a consult for medication assistance and PCP needs. Patient is agreeable for Case Manager to schedule an appointment at the [facility] and [facility]. Appointment scheduled and placed on the AVS. Patient states he has transportation to get to the appointment. Patient has used [facility] in the past and needed to be re-instated. Medications will be delivered to the bedside via [facility]. No further needs from Case Manager at this time. Gala Lewandowsky, RN,BSN Case Manager

## 2021-01-20 NOTE — Social Work (Signed)
CSW spoke with patient at bedside. CSW let patient know that nurse request for CSW to call patients mother. CSW let patient know that CSW will call patients mother shortly. Patient thanked CSW. CSW offered patient psychiatry resources patient accepted. CSW called and spoke with patients mother. CSW gave patients mother telephone number for financial counseling. No further questions reported at this time. CSW to continue to follow and assist with discharge planning needs.

## 2021-01-20 NOTE — Discharge Summary (Signed)
Discharge Summary    Patient ID: Paul Chandler MRN: [id number]; DOB: 10/09/1991  Admit date: 01/18/2021 Discharge date: 01/20/2021  PCP:  Cain Saupe, MD (Inactive)   [facility]  Cardiologist:  Reatha Harps, MD  Advanced Practice Provider:  No care team member to display Electrophysiologist:  None   Discharge Diagnoses    Principal Problem:   NSTEMI (non-ST elevated myocardial infarction) [facility]) Active Problems:   Hyperlipidemia with target low density lipoprotein (LDL) cholesterol less than 70 mg/dL   Marijuana use  Diagnostic Studies/Procedures    Cath: 01/19/21   Previously placed Prox LAD stent (unknown type) is widely patent.  Dist LAD lesion is 100% stenosed.  1st Diag lesion is 80% stenosed.   Widely patent proximal LAD stent in a twin LAD system with the largest branch being the diagonal vessel extending to the apex and the smaller LAD stopping prior to the apex.  There is distal occlusion of the small caliber apical LAD segment and suggestion of 80% narrowing in the very distal larger diagonal vessel which wraps around the apex.  The distal vessel has the appearance of possible spasm versus SCAD.   Normal large bifurcating ramus intermediate vessel, left circumflex coronary artery, and dominant RCA.  Preserved global LV contractility with a focal segment of apical to apical inferior severe hypocontractility.  EF estimate 55%.  LVEDP 11 mmHg.  RECOMMENDATION: Resumption of DAPT.  Amlodipine 2.5 mg will be initiated.  High potency statin therapy  rosuvastatin 40 mg.  If patient is unable to get target LDL less than 70, with probable familial hyperlipidemia, consider concomitant PCSK9 inhibition.  Diagnostic Dominance: Right   Cath: 01/19/21  IMPRESSIONS    1. Basal septal and apical hypokinesis . Left ventricular ejection  fraction, by estimation, is 50 to 55%. The left ventricle has low normal  function. The left ventricle  has no regional wall motion abnormalities.  There is moderate left ventricular  hypertrophy. Left ventricular diastolic parameters were normal.  2. Right ventricular systolic function is normal. The right ventricular  size is normal. There is normal pulmonary artery systolic pressure.  3. The mitral valve is normal in structure. Trivial mitral valve  regurgitation. No evidence of mitral stenosis.  4. The aortic valve is tricuspid. Aortic valve regurgitation is not  visualized. No aortic stenosis is present.  5. The inferior vena cava is normal in size with greater than 50%  respiratory variability, suggesting right atrial pressure of 3 mmHg.  _____________   History of Present Illness     Paul Chandler is a 30 y.o. male with a history of FH (by clinical criteria with LDL 200s), CAD, and marijuana use. He initially had an NSTEMI 07/2019 with a medically managed non-occlusive prox LAD thrombus, then STEMI 01/2020 with same LAD culprit, again felt to be thrombotic by IVUS. This was treated with 4.5x16 Synergy DES. There was also thrombotic occlusion of apical LAD and 90% D2 occlusion that was medically managed. EF was 50-55% with distal septal/apical HK. Evaluation for prothrombotic disorder was unrevealing. Since then, he has continued to smoke marijuana and his follow-up and cardiac rehab compliance has been poor. He presented to the ED for evaluation of chest pain and was found to have a mildly elevated but up-trending troponin.  Reports doing well following last MI. Works at [employer], occasionally works out, has 3 kids ages 7-12, not noted any dyspnea or chest pain with these activities. The day of admission, around  4 pm he was in an argument when he suddenly felt like there was a "boulder on his chest". This was associated with dyspnea as well, but no nausea or diaphoresis. No radiation. Felt like prior MI. The chest pain persisted even after he tried to relax for the next hour, so he came to  the ED. Chest pain was gradually improving at the time of his arrival without intervention, resolved with nitro. ECG essentially normal. Troponin 63->153.   Of note, atorvastatin fell off med list 03/16/20 and he had not been taking this. He also started taking Tylenol to counteract the headache from Imdur that was started last year and was told by a friend that it was pretty much the same thing as aspirin, so he stopped taking aspiring shortly after his last PCI. He was very compliant with Plavix until one month ago when he says the cost was suddenly $400 so he didn't fill it or let us know. He is mainly just taking metoprolol.  Admitted to cardiology for further management.   Hospital Course    1. NSTEMI: hsTn peaked at 3233, underwent cardiac cath noted above with widely patent pLAD stent in twin LAD system. Distal occlusion of the small caliber apical LAD segment and 80% narrowing in distal larger diagonal vessel with the appearance of SCAD vs spasm. Recommendations for medical management with ASA/plavix, along with continuation of metoprolol and Crestor 40mg  daily. No recurrent chest pain post cath. Worked well with cardiac rehab.  -- continue ASA, plavix, BB and statin  2. HLD: suspect FH given his LDL in the 200s. Had been on atorvastatin in the past but developed elevated ALT of 97. Therefore this was stopped. Stable LFTs in on admission -- will resume on Crestor 40mg  daily -- follow up LFT/FLP in 3 months -- referral placed for lipid clinic at discharge for PCSK9 consideration  3. Pre-DM: had been Rx for metformin in the past, but was not taking. Hgb A1c 5.9 -- will not resume metformin at this time -- close follow up of Hgb A1c as an outpatient -- CM consult to establish with PCP  4. Marijuana abuse: cessation advised  General: Well developed, well nourished, male appearing in no acute distress. Head: Normocephalic, atraumatic.  Neck: Supple without bruits, JVD. Lungs:  Resp  regular and unlabored, CTA. Heart: RRR, S1, S2, no S3, S4, or murmur; no rub. Abdomen: Soft, non-tender, non-distended with normoactive bowel sounds. No hepatomegaly. No rebound/guarding. No obvious abdominal masses. Extremities: No clubbing, cyanosis, edema. Distal pedal pulses are 2+ bilaterally. Right radial cath site stable without bruising or hematoma Neuro: Alert and oriented X 3. Moves all extremities spontaneously. Psych: Normal affect.  Patient was seen by Dr. Herbie BaltimoreHarding and deemed stable for discharge home. Follow up in the office was arranged. Medications sent to [facility] pharmacy. Educated by PharmD prior to discharge.   Did the patient have an acute coronary syndrome (MI, NSTEMI, STEMI, etc) this admission?:  Yes                               AHA/ACC Clinical Performance & Quality Measures: 1. Aspirin prescribed? - Yes 2. ADP Receptor Inhibitor (Plavix/Clopidogrel, Brilinta/Ticagrelor or Effient/Prasugrel) prescribed (includes medically managed patients)? - Yes 3. Beta Blocker prescribed? - Yes 4. High Intensity Statin (Lipitor 40-80mg  or Crestor 20-40mg ) prescribed? - Yes 5. EF assessed during THIS hospitalization? - Yes 6. For EF <40%, was ACEI/ARB prescribed? - Not Applicable (  EF >/= 40%) 7. For EF <40%, Aldosterone Antagonist (Spironolactone or Eplerenone) prescribed? - Not Applicable (EF >/= 40%) 8. Cardiac Rehab Phase II ordered (including medically managed patients)? - Yes       _____________  Discharge Vitals Blood pressure 122/89, pulse 65, temperature 98.5 F (36.9 C), temperature source Oral, resp. rate 17, height  (1.676 m), weight 83.3 kg, SpO2 97 %.  Filed Weights   01/18/21 2041 01/20/21 0646  Weight: 90.7 kg 83.3 kg    Labs & Radiologic Studies    CBC Recent Labs    01/19/21 0257 01/20/21 0219  WBC 8.0 5.5  HGB 15.3 15.2  HCT 47.4 46.5  MCV 87.8 87.6  PLT 328 318   Basic Metabolic Panel Recent Labs    16/10/96 1803 01/19/21 1152  01/20/21 0219  NA 136  --  135  K 4.1  --  4.2  CL 104  --  104  CO2 25  --  25  GLUCOSE 100*  --  96  BUN 12  --  13  CREATININE 1.13 1.13 1.16  CALCIUM 10.0  --  9.5   Liver Function Tests Recent Labs    01/20/21 0219  AST 26  ALT 33  ALKPHOS 58  BILITOT 0.8  PROT 6.4*  ALBUMIN 3.8   No results for input(s): LIPASE, AMYLASE in the last 72 hours. High Sensitivity Troponin:   Recent Labs  Lab 01/18/21 1803 01/18/21 2004 01/19/21 0000 01/19/21 1152  TROPONINIHS 63* 153* 509* 3,233*    BNP Invalid input(s): POCBNP D-Dimer No results for input(s): DDIMER in the last 72 hours. Hemoglobin A1C Recent Labs    01/19/21 0257  HGBA1C 5.9*   Fasting Lipid Panel Recent Labs    01/19/21 0257  CHOL 285*  HDL 40*  LDLCALC 227*  TRIG 92  CHOLHDL 7.1   Thyroid Function Tests No results for input(s): TSH, T4TOTAL, T3FREE, THYROIDAB in the last 72 hours.  Invalid input(s): FREET3 _____________  DG Chest 2 View  Result Date: 01/18/2021 CLINICAL DATA:  Chest pain and pressure, shortness of breath, weakness and swelling for 1 hour EXAM: CHEST - 2 VIEW COMPARISON:  Radiograph 08/22/2019, CT 07/01/2015 FINDINGS: No consolidation, features of edema, pneumothorax, or effusion. Pulmonary vascularity is normally distributed. The cardiomediastinal contours are unremarkable. No acute osseous or soft tissue abnormality. IMPRESSION: No acute cardiopulmonary abnormality. Electronically Signed   By: Kreg Shropshire M.D.   On: 01/18/2021 18:26   CARDIAC CATHETERIZATION  Result Date: 01/19/2021  Previously placed Prox LAD stent (unknown type) is widely patent.  Dist LAD lesion is 100% stenosed.  1st Diag lesion is 80% stenosed.  Widely patent proximal LAD stent in a twin LAD system with the largest branch being the diagonal vessel extending to the apex and the smaller LAD stopping prior to the apex.  There is distal occlusion of the small caliber apical LAD segment and suggestion of 80%  narrowing in the very distal larger diagonal vessel which wraps around the apex.  The distal vessel has the appearance of possible spasm versus SCAD. Normal large bifurcating ramus intermediate vessel, left circumflex coronary artery, and dominant RCA. Preserved global LV contractility with a focal segment of apical to apical inferior severe hypocontractility.  EF estimate 55%.  LVEDP 11 mmHg. RECOMMENDATION: Resumption of DAPT.  Amlodipine 2.5 mg will be initiated.  High potency statin therapy  rosuvastatin 40 mg.  If patient is unable to get target LDL less than 70, with probable familial hyperlipidemia,  consider concomitant PCSK9 inhibition.   ECHOCARDIOGRAM COMPLETE  Result Date: 01/19/2021    ECHOCARDIOGRAM REPORT   Patient Name:   Paul Chandler Date of Exam: 01/19/2021 Medical Rec #:  [id number]      Height:       66.0 in Accession #:    [id number]     Weight:       200.0 lb Date of Birth:  June 27, 1991      BSA:          2.000 m Patient Age:    29 years       BP:           124/85 mmHg Patient Gender: M              HR:           65 bpm. Exam Location:  Inpatient Procedure: 2D Echo, 3D Echo, Cardiac Doppler, Color Doppler and Strain Analysis Indications:    121-121.4 ST elevation (STEMI) and non-ST elevation (NSTEMI)                 myocardial infarction  History:        Patient has prior history of Echocardiogram examinations, most                 recent 02/05/2020. Cardiomyopathy; CAD, Previous Myocardial                 Infarction and Acute MI.  Sonographer:    Sheralyn Boatman RDCS Referring Phys: [id number] SCOTT W ROSE  Sonographer Comments: Technically difficult study due to poor echo windows. Global longitudinal strain was attempted. Patient supine post cath. IMPRESSIONS  1. Basal septal and apical hypokinesis . Left ventricular ejection fraction, by estimation, is 50 to 55%. The left ventricle has low normal function. The left ventricle has no regional wall motion abnormalities. There is moderate left  ventricular hypertrophy. Left ventricular diastolic parameters were normal.  2. Right ventricular systolic function is normal. The right ventricular size is normal. There is normal pulmonary artery systolic pressure.  3. The mitral valve is normal in structure. Trivial mitral valve regurgitation. No evidence of mitral stenosis.  4. The aortic valve is tricuspid. Aortic valve regurgitation is not visualized. No aortic stenosis is present.  5. The inferior vena cava is normal in size with greater than 50% respiratory variability, suggesting right atrial pressure of 3 mmHg. FINDINGS  Left Ventricle: Basal septal and apical hypokinesis. Left ventricular ejection fraction, by estimation, is 50 to 55%. The left ventricle has low normal function. The left ventricle has no regional wall motion abnormalities. The left ventricular internal  cavity size was normal in size. There is moderate left ventricular hypertrophy. Left ventricular diastolic parameters were normal. Right Ventricle: The right ventricular size is normal. No increase in right ventricular wall thickness. Right ventricular systolic function is normal. There is normal pulmonary artery systolic pressure. The tricuspid regurgitant velocity is 1.89 m/s, and  with an assumed right atrial pressure of 3 mmHg, the estimated right ventricular systolic pressure is 17.3 mmHg. Left Atrium: Left atrial size was normal in size. Right Atrium: Right atrial size was normal in size. Pericardium: There is no evidence of pericardial effusion. Mitral Valve: The mitral valve is normal in structure. Trivial mitral valve regurgitation. No evidence of mitral valve stenosis. Tricuspid Valve: The tricuspid valve is normal in structure. Tricuspid valve regurgitation is trivial. No evidence of tricuspid stenosis. Aortic Valve: The aortic valve is tricuspid. Aortic valve regurgitation is  not visualized. No aortic stenosis is present. Pulmonic Valve: The pulmonic valve was normal in  structure. Pulmonic valve regurgitation is not visualized. No evidence of pulmonic stenosis. Aorta: The aortic root is normal in size and structure. Venous: The inferior vena cava is normal in size with greater than 50% respiratory variability, suggesting right atrial pressure of 3 mmHg. IAS/Shunts: No atrial level shunt detected by color flow Doppler.  LEFT VENTRICLE PLAX 2D LVIDd:         4.00 cm     Diastology LVIDs:         2.80 cm     LV e' medial:    9.14 cm/s LV PW:         1.40 cm     LV E/e' medial:  8.6 LV IVS:        1.50 cm     LV e' lateral:   9.25 cm/s LVOT diam:     2.00 cm     LV E/e' lateral: 8.5 LV SV:         56 LV SV Index:   28 LVOT Area:     3.14 cm                             3D Volume EF: LV Volumes (MOD)           3D EF:        56 % LV vol d, MOD A2C: 80.1 ml LV EDV:       139 ml LV vol d, MOD A4C: 76.0 ml LV ESV:       61 ml LV vol s, MOD A2C: 26.2 ml LV SV:        77 ml LV vol s, MOD A4C: 29.1 ml LV SV MOD A2C:     53.9 ml LV SV MOD A4C:     76.0 ml LV SV MOD BP:      52.9 ml RIGHT VENTRICLE             IVC RV S prime:     10.30 cm/s  IVC diam: 2.10 cm TAPSE (M-mode): 2.0 cm LEFT ATRIUM           Index       RIGHT ATRIUM           Index LA diam:      3.00 cm 1.50 cm/m  RA Area:     12.50 cm LA Vol (A2C): 27.1 ml 13.55 ml/m RA Volume:   26.60 ml  13.30 ml/m LA Vol (A4C): 32.7 ml 16.35 ml/m  AORTIC VALVE             PULMONIC VALVE LVOT Vmax:   87.90 cm/s  PR End Diast Vel: 1.30 msec LVOT Vmean:  60.000 cm/s LVOT VTI:    0.179 m  AORTA Ao Root diam: 3.20 cm Ao Asc diam:  2.80 cm MITRAL VALVE               TRICUSPID VALVE MV Area (PHT): 7.16 cm    TR Peak grad:   14.3 mmHg MV Decel Time: 106 msec    TR Vmax:        189.00 cm/s MV E velocity: 78.50 cm/s MV A velocity: 58.30 cm/s  SHUNTS MV E/A ratio:  1.35        Systemic VTI:  0.18 m  Systemic Diam: 2.00 cm Charlton Haws MD Electronically signed by Charlton Haws MD Signature Date/Time: 01/19/2021/3:04:14 PM     Final    Disposition   Pt is being discharged home today in good condition.  Follow-up Plans & Appointments     Follow-up Information    [facility] Follow up on 03/03/2021.   Why: for hospital follow up appointment @ 8:30 am with [facility]. Please arrive at least 15 minutes early. If you cannot make this scheduled appointment please call the office. Onsite pharmacy-medications range from $4.00-$10.00 Contact information: [address] [place] [address] [phone]       Sande Rives, MD Follow up on 02/02/2021.   Specialties: Internal Medicine, Cardiology, Radiology Why: @8 :40am for your follow up appt Contact information: [address] [place] [phone]              Discharge Instructions    AMB Referral to [facility]   Complete by: As directed    Lipid rx   Reason For Referral: Lipids   Amb Referral to Cardiac Rehabilitation   Complete by: As directed    virtual   Diagnosis: NSTEMI   After initial evaluation and assessments completed: Virtual Based Care may be provided alone or in conjunction with Phase 2 Cardiac Rehab based on patient barriers.: Yes   Diet - low sodium heart healthy   Complete by: As directed    Discharge instructions   Complete by: As directed    Radial Site Care Refer to this sheet in the next few weeks. These instructions provide you with information on caring for yourself after your procedure. Your caregiver may also give you more specific instructions. Your treatment has been planned according to current medical practices, but problems sometimes occur. Call your caregiver if you have any problems or questions after your procedure. HOME CARE INSTRUCTIONS You may shower the day after the procedure.Remove the bandage (dressing) and gently wash the site with plain soap and water.Gently pat the site dry.  Do not apply powder or lotion to the site.   Do not submerge the affected site in water for 3 to 5 days.  Inspect the site at least twice daily.  Do not flex or bend the affected arm for 24 hours.  No lifting over 5 pounds (2.3 kg) for 5 days after your procedure.  Do not drive home if you are discharged the same day of the procedure. Have someone else drive you.  You may drive 24 hours after the procedure unless otherwise instructed by your caregiver.  What to expect: Any bruising will usually fade within 1 to 2 weeks.  Blood that collects in the tissue (hematoma) may be painful to the touch. It should usually decrease in size and tenderness within 1 to 2 weeks.  SEEK IMMEDIATE MEDICAL CARE IF: You have unusual pain at the radial site.  You have redness, warmth, swelling, or pain at the radial site.  You have drainage (other than a small amount of blood on the dressing).  You have chills.  You have a fever or persistent symptoms for more than 72 hours.  You have a fever and your symptoms suddenly get worse.  Your arm becomes pale, cool, tingly, or numb.  You have heavy bleeding from the site. Hold pressure on the site.   PLEASE DO NOT MISS ANY DOSES OF YOUR PLAVIX!!!!! Also keep a log of you blood pressures and bring back to  your follow up appt. Please call the office with any questions.   Patients taking blood thinners should generally stay away from medicines like ibuprofen, Advil, Motrin, naproxen, and Aleve due to risk of stomach bleeding. You may take Tylenol as directed or talk to your primary doctor about alternatives.  PLEASE ENSURE THAT YOU DO NOT RUN OUT OF YOUR PLAVIX. This medication is very important to remain on for at least one year. IF you have issues obtaining this medication due to cost please CALL the office 3-5 business days prior to running out in order to prevent missing doses of this medication.   Increase activity slowly   Complete by: As directed       Discharge Medications   Allergies as of 01/20/2021    No Known Allergies     Medication List    STOP taking these medications   isosorbide mononitrate 30 MG 24 hr tablet Commonly known as: IMDUR   metFORMIN 500 MG tablet Commonly known as: GLUCOPHAGE     TAKE these medications   amLODipine 2.5 MG tablet Commonly known as: NORVASC Take 1 tablet (2.5 mg total) by mouth daily. Start taking on: January 21, 2021   aspirin 81 MG EC tablet Take 1 tablet (81 mg total) by mouth daily.   clopidogrel 75 MG tablet Commonly known as: PLAVIX Take 1 tablet (75 mg total) by mouth daily.   metoprolol succinate 25 MG 24 hr tablet Commonly known as: TOPROL-XL Take 1 tablet (25 mg total) by mouth daily. Take with or immediately following a meal.   nitroGLYCERIN 0.4 MG SL tablet Commonly known as: NITROSTAT Place 1 tablet (0.4 mg total) under the tongue every 5 (five) minutes as needed for chest pain (up to 3 doses).   rosuvastatin 40 MG tablet Commonly known as: CRESTOR Take 1 tablet (40 mg total) by mouth daily. Start taking on: January 21, 2021         Outstanding Labs/Studies   FLP/LFTs in 8 weeks Referral placed to lipid clinic  Duration of Discharge Encounter   Greater than 30 minutes including physician time.  Signed, Laverda Page, NP 01/20/2021, 12:47 PM

## 2021-01-20 NOTE — Progress Notes (Signed)
CARDIAC REHAB PHASE I   PRE:  Rate/Rhythm: 66 SR    BP: sitting 122/89    SaO2:   MODE:  Ambulation: 750 ft   POST:  Rate/Rhythm: 83 SR    BP: sitting 129/90     SaO2:   Tolerated well, no angina. In depth discussion of MI, restrictions, Plavix/med importance, diet including decreasing sodas to one a day or switching to diet, exercise, NTG and CRPII. Pt voiced understanding. Will refer to [place] CRPII virtual. Needs new NTG.  [id number]   Harriet Masson CES, ACSM 01/20/2021 9:34 AM

## 2021-01-21 ENCOUNTER — Telehealth: Payer: Self-pay

## 2021-01-21 NOTE — Telephone Encounter (Signed)
Transition Care Management Unsuccessful Follow-up Telephone Call  Date of discharge and from where:  [facility]   Attempts:  1st Attempt  Reason for unsuccessful TCM follow-up call:  Left voice message, unable to reach or leave VM at this time. Option not avalable

## 2021-01-24 ENCOUNTER — Telehealth: Payer: Self-pay

## 2021-01-24 NOTE — Telephone Encounter (Signed)
Transition Care Management Unsuccessful Follow-up Telephone Call  Date of discharge and from where:  [facility] on 01/20/2021  Attempts:  2st Attempt  Reason for unsuccessful TCM follow-up call:  Left voice message, unable to reach or leave VM at this time. Option not avalable

## 2021-01-25 ENCOUNTER — Telehealth: Payer: Self-pay

## 2021-01-25 NOTE — Telephone Encounter (Signed)
Transition Care Management Unsuccessful Follow-up Telephone Call  Date of discharge and from where:[facility] 01/20/2021  Attempts:3rd Attempt  Reason for unsuccessful TCM follow-up call:Left voice message, unable to reach or leave VM at this time. Option not avalable
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
## 2021-02-02 ENCOUNTER — Other Ambulatory Visit (HOSPITAL_COMMUNITY): Payer: Self-pay

## 2021-02-02 ENCOUNTER — Encounter: Payer: Self-pay | Admitting: Cardiovascular Disease

## 2021-02-02 ENCOUNTER — Ambulatory Visit (INDEPENDENT_AMBULATORY_CARE_PROVIDER_SITE_OTHER): Payer: Self-pay | Admitting: Cardiovascular Disease

## 2021-02-02 ENCOUNTER — Other Ambulatory Visit: Payer: Self-pay

## 2021-02-02 VITALS — BP 112/82 | HR 73 | Ht 66.0 in | Wt 183.4 lb

## 2021-02-02 DIAGNOSIS — I214 Non-ST elevation (NSTEMI) myocardial infarction: Secondary | ICD-10-CM

## 2021-02-02 DIAGNOSIS — E7849 Other hyperlipidemia: Secondary | ICD-10-CM

## 2021-02-02 DIAGNOSIS — I2510 Atherosclerotic heart disease of native coronary artery without angina pectoris: Secondary | ICD-10-CM

## 2021-02-02 MED ORDER — AMLODIPINE BESYLATE 2.5 MG PO TABS
2.5000 mg | ORAL_TABLET | Freq: Every day | ORAL | 0 refills | Status: DC
  Filled 2021-02-02: qty 90, 90d supply, fill #0

## 2021-02-02 MED ORDER — CLOPIDOGREL BISULFATE 75 MG PO TABS
75.0000 mg | ORAL_TABLET | Freq: Every day | ORAL | 2 refills | Status: DC
  Filled 2021-02-02: qty 90, 90d supply, fill #0

## 2021-02-02 MED ORDER — ASPIRIN 81 MG PO TBEC
81.0000 mg | DELAYED_RELEASE_TABLET | Freq: Every day | ORAL | 1 refills | Status: DC
  Filled 2021-02-02: qty 90, 90d supply, fill #0

## 2021-02-02 MED ORDER — METOPROLOL SUCCINATE ER 25 MG PO TB24
25.0000 mg | ORAL_TABLET | Freq: Every day | ORAL | 1 refills | Status: DC
  Filled 2021-02-02: qty 90, 90d supply, fill #0

## 2021-02-02 MED ORDER — ROSUVASTATIN CALCIUM 40 MG PO TABS
40.0000 mg | ORAL_TABLET | Freq: Every day | ORAL | 1 refills | Status: DC
  Filled 2021-02-02: qty 90, 90d supply, fill #0

## 2021-02-02 NOTE — Patient Instructions (Signed)
Medication Instructions:  The current medical regimen is effective;  continue present plan and medications.  *If you need a refill on your cardiac medications before your next appointment, please call your pharmacy*   Lab Work: LIPID/LIVER in 4-6 weeks (come back fasting- nothing to eat or drink, no lab appointment needed)  If you have labs (blood work) drawn today and your tests are completely normal, you will receive your results only by: Marland Kitchen MyChart Message (if you have MyChart) OR . A paper copy in the mail If you have any lab test that is abnormal or we need to change your treatment, we will call you to review the results.   Follow-Up: At [facility], you and your health needs are our priority.  As part of our continuing mission to provide you with exceptional heart care, we have created designated Provider Care Teams.  These Care Teams include your primary Cardiologist (physician) and Advanced Practice Providers (APPs -  Physician Assistants and Nurse Practitioners) who all work together to provide you with the care you need, when you need it.  We recommend signing up for the patient portal called "MyChart".  Sign up information is provided on this After Visit Summary.  MyChart is used to connect with patients for Virtual Visits (Telemedicine).  Patients are able to view lab/test results, encounter notes, upcoming appointments, etc.  Non-urgent messages can be sent to your provider as well.   To learn more about what you can do with MyChart, go to [email or web address].    Your next appointment:   4 week(s)  The format for your next appointment:   In Person  Provider:   You will see one of the following Advanced Practice Providers on your designated Care Team:    Azalee Course, PA-C  Micah Flesher, PA-C or   Judy Pimple, [place]  Then, Reatha Harps, MD will plan to see you again in 4 month(s).

## 2021-02-15 ENCOUNTER — Ambulatory Visit: Payer: Medicaid Other
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
## 2021-03-02 NOTE — Progress Notes (Deleted)
Patient ID: Paul Chandler, male   DOB: 1991/06/25, 30 y.o.   MRN: [id number]   After hospitalization 3/29-3/31/2022 and saw cardiology in follow up 02/02/2021:   From discharge summary: Principal Problem:   NSTEMI (non-ST elevated myocardial infarction) (HCC) Active Problems:   Hyperlipidemia with target low density lipoprotein (LDL) cholesterol less than 70 mg/dL   Marijuana use   Paul Chandler is a 30 y.o. male with a history of FH (by clinical criteria with LDL 200s), CAD, and marijuana use. He initially had an NSTEMI 07/2019 with a medically managed non-occlusive prox LAD thrombus, thenSTEMI 4/2021withsame LAD culprit, again felt to be thrombotic by IVUS. This was treated with 4.5x16 Synergy DES. There was also thrombotic occlusion of apical LAD and 90% D2 occlusion that was medically managed. EF was 50-55% with distal septal/apical HK. Evaluation for prothrombotic disorder was unrevealing. Since then, he has continued to smoke marijuana and his follow-up and cardiac rehab compliance has been poor. He presented to the ED for evaluation of chest pain and was found to have a mildly elevated but up-trending troponin.  Reports doing well following last MI. Works at [employer], occasionally works out, has 3 kids ages 7-12, not noted any dyspnea or chest pain with these activities. The day of admission, around 4 pm he was in an argument when he suddenly felt like there was a "boulder on his chest". This was associated with dyspnea as well, but no nausea or diaphoresis. No radiation. Felt like prior MI. The chest pain persisted even after he tried to relax for the next hour, so he came to the ED. Chest pain was gradually improving at the time of his arrival without intervention, resolved with nitro. ECG essentially normal. Troponin 63->153.   Of note, atorvastatin fell off med list 03/16/20 and he had not been taking this. He also started taking Tylenol to counteract the headache from Imdur that  was started last year and was told by a friend that it was pretty much the same thing as aspirin, so he stopped taking aspiring shortly after his last PCI. He was very compliant with Plavix until one month ago when he says the cost was suddenly $400 so he didn't fill it or let us know. He is mainly just taking metoprolol. Admitted to cardiology for further management.   1. NSTEMI: hsTn peaked at 3233, underwent cardiac cath noted above with widely patent pLAD stent in twin LAD system. Distal occlusion of the small caliber apical LAD segment and 80% narrowing in distal larger diagonal vessel with the appearance of SCAD vs spasm. Recommendations for medical management with ASA/plavix, along with continuation of metoprolol and Crestor 40mg  daily. No recurrent chest pain post cath. Worked well with cardiac rehab.  -- continue ASA, plavix, BB and statin  2. HLD: suspect FH given his LDL in the 200s. Had been on atorvastatin in the past but developed elevated ALT of 97. Therefore this was stopped. Stable LFTs in on admission -- will resume on Crestor 40mg  daily -- follow up LFT/FLP in 3 months -- referral placed for lipid clinic at discharge for PCSK9 consideration  3. Pre-DM: had been Rx for metformin in the past, but was not taking. Hgb A1c 5.9 -- will not resume metformin at this time -- close follow up of Hgb A1c as an outpatient -- CM consult to establish with PCP  4. Marijuana abuse: cessation advised  General: Well developed, well nourished, male appearing in no acute distress. Head: Normocephalic,  atraumatic.      Neck: Supple without bruits, JVD. Lungs:  Resp regular and unlabored, CTA. Heart: RRR, S1, S2, no S3, S4, or murmur; no rub. Abdomen: Soft, non-tender, non-distended with normoactive bowel sounds. No hepatomegaly. No rebound/guarding. No obvious abdominal masses. Extremities: No clubbing, cyanosis, edema. Distal pedal pulses are 2+ bilaterally. Right radial cath site stable  without bruising or hematoma Neuro: Alert and oriented X 3. Moves all extremities spontaneously. Psych: Normal affect.  Patient was seen by Dr. Herbie Baltimore and deemed stable for discharge home. Follow up in the office was arranged. Medications sent to [facility] pharmacy. Educated by PharmD prior to discharge.   Did the patient have an acute coronary syndrome (MI, NSTEMI, STEMI, etc) this admission?:  Yes                               AHA/ACC Clinical Performance & Quality Measures: 1. Aspirin prescribed? - Yes 2. ADP Receptor Inhibitor (Plavix/Clopidogrel, Brilinta/Ticagrelor or Effient/Prasugrel) prescribed (includes medically managed patients)? - Yes 3. Beta Blocker prescribed? - Yes 4. High Intensity Statin (Lipitor 40-80mg  or Crestor 20-40mg ) prescribed? - Yes 5. EF assessed during THIS hospitalization? - Yes 6. For EF <40%, was ACEI/ARB prescribed? - Not Applicable (EF >/= 40%) 7. For EF <40%, Aldosterone Antagonist (Spironolactone or Eplerenone) prescribed? - Not Applicable (EF >/= 40%) 8. Cardiac Rehab Phase II ordered (including medically managed patients)? - Yes     saw cardiology 4/13-this is from their A/P:  1. Coronary artery disease involving native coronary artery of native heart without angina pectoris -NSTEMI 07/2019 -STEMI 02/05/2020 (LAD thrombus) -> PCI pLAD -NSTEMI 01/19/2021 -> distal LAD occlusion spasm vs SCAD -Very severely elevated LDL cholesterol.  History of CAD status post stenting to the proximal LAD.  He stopped taking his medications and was readmitted last month with occlusion of the distal LAD versus scad.  I suspect this is all early atherosclerosis given his severely elevated LDL cholesterol.  His stent is patent. -We will continue on aspirin and Plavix. -He is on metoprolol and Norvasc in the case this was spasm. -He will continue his Crestor 40 mg daily.  We will plan to recheck a lipid profile and liver enzymes in 4 to 6 weeks.  He likely will need a PCSK9  inhibitor.  I have also recommended genetic testing for familial hypercholesterolemia.  We will send this panel.  2. Familial hyperlipidemia -Severely elevated LDL cholesterol with early CAD.  We will send genetic testing for familial hypercholesterolemia.  Continue with statin.  Recheck LDL cholesterol and liver enzymes in the next 4 to 6 weeks.  He will see me in 3 months afterwards.  We will likely have to get him on a PCSK9 inhibitor.  Disposition: Return in about 3 months (around 05/04/2021).

## 2021-03-03 ENCOUNTER — Inpatient Hospital Stay: Payer: Medicaid Other | Admitting: Physician Assistant
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
## 2021-03-08 ENCOUNTER — Encounter: Payer: Self-pay | Admitting: General Practice

## 2021-03-08 ENCOUNTER — Ambulatory Visit (INDEPENDENT_AMBULATORY_CARE_PROVIDER_SITE_OTHER): Payer: Self-pay | Admitting: General Practice

## 2021-03-08 ENCOUNTER — Other Ambulatory Visit: Payer: Self-pay

## 2021-03-08 VITALS — BP 134/80 | HR 80 | Ht 67.0 in | Wt 189.0 lb

## 2021-03-08 DIAGNOSIS — Z79899 Other long term (current) drug therapy: Secondary | ICD-10-CM

## 2021-03-08 DIAGNOSIS — E7849 Other hyperlipidemia: Secondary | ICD-10-CM

## 2021-03-08 DIAGNOSIS — I2510 Atherosclerotic heart disease of native coronary artery without angina pectoris: Secondary | ICD-10-CM

## 2021-03-08 NOTE — Patient Instructions (Signed)
Medication Instructions:  The current medical regimen is effective;  continue present plan and medications as directed. Please refer to the Current Medication list given to you today.  *If you need a refill on your cardiac medications before your next appointment, please call your pharmacy*  Lab Work: FASTING LIPID AND LFT ASAP If you have labs (blood work) drawn today and your tests are completely normal, you will receive your results only by:  MyChart Message (if you have MyChart) OR A paper copy in the mail.  If you have any lab test that is abnormal or we need to change your treatment, we will call you to review the results. You may go to any [facility] that is convenient for you however, we do have a lab in our office that is able to assist you. You DO NOT need an appointment for our lab. The lab is open 8:00am and closes at 4:00pm. Lunch 12:45 - 1:45pm.  Special Instructions PLEASE READ AND FOLLOW SALTY 6-ATTACHED-1,800 mg daily  PLEASE INCREASE PHYSICAL ACTIVITY AS TOLERATED  Follow-Up: Your next appointment:  6 month(s) In Person with You may see Reatha Harps, MD IF UNAVAILABLE JESSE CLEAVER, FNP-C or one of the following Advanced Practice Providers on your designated Care Team:  Azalee Course, PA-C Micah Flesher, [place] or Judy Pimple, PA-C   Please call our office 2 months in advance to schedule this appointment   At [facility], you and your health needs are our priority.  As part of our continuing mission to provide you with exceptional heart care, we have created designated Provider Care Teams.  These Care Teams include your primary Cardiologist (physician) and Advanced Practice Providers (APPs -  Physician Assistants and Nurse Practitioners) who all work together to provide you with the care you need, when you need it.  We recommend signing up for the patient portal called "MyChart".  Sign up information is provided on this After Visit Summary.  MyChart is used to connect with patients  for Virtual Visits (Telemedicine).  Patients are able to view lab/test results, encounter notes, upcoming appointments, etc.  Non-urgent messages can be sent to your provider as well.   To learn more about what you can do with MyChart, go to [email or web address].

## 2021-03-11 ENCOUNTER — Encounter (HOSPITAL_COMMUNITY): Payer: Self-pay

## 2021-03-28 ENCOUNTER — Telehealth (HOSPITAL_COMMUNITY): Payer: Self-pay

## 2021-03-28 NOTE — Telephone Encounter (Signed)
No response from pt.  Closed referral  
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
## 2021-06-07 ENCOUNTER — Ambulatory Visit: Payer: Medicaid Other | Admitting: Cardiovascular Disease

## 2021-06-07 DIAGNOSIS — E7849 Other hyperlipidemia: Secondary | ICD-10-CM

## 2021-06-07 DIAGNOSIS — I2510 Atherosclerotic heart disease of native coronary artery without angina pectoris: Secondary | ICD-10-CM

## 2021-09-08 ENCOUNTER — Encounter (HOSPITAL_COMMUNITY): Payer: Self-pay | Admitting: Emergency Medicine

## 2021-09-08 ENCOUNTER — Other Ambulatory Visit: Payer: Self-pay

## 2021-09-08 ENCOUNTER — Ambulatory Visit (HOSPITAL_COMMUNITY)
Admission: EM | Admit: 2021-09-08 | Discharge: 2021-09-08 | Disposition: A | Discharge status: Home / Self Care | Payer: Medicaid Other | Attending: Internal Medicine | Admitting: Internal Medicine

## 2021-09-08 DIAGNOSIS — N489 Disorder of penis, unspecified: Secondary | ICD-10-CM

## 2021-09-08 DIAGNOSIS — H1031 Unspecified acute conjunctivitis, right eye: Secondary | ICD-10-CM

## 2021-09-08 DIAGNOSIS — Z113 Encounter for screening for infections with a predominantly sexual mode of transmission: Secondary | ICD-10-CM

## 2021-09-08 DIAGNOSIS — Z760 Encounter for issue of repeat prescription: Secondary | ICD-10-CM

## 2021-09-08 DIAGNOSIS — I214 Non-ST elevation (NSTEMI) myocardial infarction: Secondary | ICD-10-CM

## 2021-09-08 MED ORDER — METOPROLOL SUCCINATE ER 25 MG PO TB24: 25.0000 mg | ORAL_TABLET | Freq: Every day | ORAL | 1 refills | Status: DC

## 2021-09-08 MED ORDER — TETRACAINE HCL 0.5 % OP SOLN
OPHTHALMIC | Status: AC
  Filled 2021-09-08: qty 4

## 2021-09-08 MED ORDER — ERYTHROMYCIN 5 MG/GM OP OINT: TOPICAL_OINTMENT | Freq: Three times a day (TID) | OPHTHALMIC | 0 refills | Status: AC

## 2021-09-08 MED ORDER — ASPIRIN 81 MG PO TBEC: 81.0000 mg | DELAYED_RELEASE_TABLET | Freq: Every day | ORAL | 1 refills | Status: DC

## 2021-09-08 MED ORDER — ROSUVASTATIN CALCIUM 40 MG PO TABS: 40.0000 mg | ORAL_TABLET | Freq: Every day | ORAL | 1 refills | Status: DC

## 2021-09-08 MED ORDER — CLOPIDOGREL BISULFATE 75 MG PO TABS: 75.0000 mg | ORAL_TABLET | Freq: Every day | ORAL | 1 refills | Status: DC

## 2021-09-08 MED ORDER — AMLODIPINE BESYLATE 2.5 MG PO TABS: 2.5000 mg | ORAL_TABLET | Freq: Every day | ORAL | 1 refills | Status: DC

## 2021-09-08 MED ORDER — NITROGLYCERIN 0.4 MG SL SUBL: 0.4000 mg | SUBLINGUAL_TABLET | SUBLINGUAL | 2 refills | Status: DC | PRN
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
## 2021-09-08 NOTE — ED Triage Notes (Signed)
Pt is present today with right eye swelling and drainage. Pt states that he broke some glass last week. Pt thinks a piece may have gotten in his eye but is not sure.   Pt is also having penile itching that started today. Pt denies any penile discharge

## 2021-09-08 NOTE — Discharge Instructions (Addendum)
Please use medications as prescribed Medication refills have been sent to the pharmacy Strict handwashing practices recommended If symptoms worsens please return to urgent care to be reevaluated We will call you with recommendations if labs are abnormal Return to urgent care if you have worsening symptoms.

## 2021-09-12 LAB — CYTOLOGY, (ORAL, ANAL, URETHRAL) ANCILLARY ONLY
Chlamydia: NEGATIVE
Comment: NEGATIVE
Comment: NEGATIVE
Comment: NORMAL
Neisseria Gonorrhea: NEGATIVE
Trichomonas: NEGATIVE

## 2022-02-03 ENCOUNTER — Other Ambulatory Visit: Payer: Self-pay

## 2022-02-03 ENCOUNTER — Encounter (HOSPITAL_COMMUNITY): Payer: Self-pay | Admitting: Pharmacy Technician

## 2022-02-03 ENCOUNTER — Emergency Department (HOSPITAL_COMMUNITY): Payer: Medicaid Other

## 2022-02-03 ENCOUNTER — Emergency Department (HOSPITAL_COMMUNITY)
Admission: EM | Admit: 2022-02-03 | Discharge: 2022-02-03 | Disposition: A | Discharge status: Home / Self Care | Payer: Medicaid Other | Attending: Emergency Medicine | Admitting: Emergency Medicine

## 2022-02-03 DIAGNOSIS — W540XXA Bitten by dog, initial encounter: Secondary | ICD-10-CM | POA: Insufficient documentation

## 2022-02-03 DIAGNOSIS — Z23 Encounter for immunization: Secondary | ICD-10-CM | POA: Insufficient documentation

## 2022-02-03 DIAGNOSIS — S61451A Open bite of right hand, initial encounter: Secondary | ICD-10-CM | POA: Insufficient documentation

## 2022-02-03 DIAGNOSIS — Z7982 Long term (current) use of aspirin: Secondary | ICD-10-CM | POA: Insufficient documentation

## 2022-02-03 DIAGNOSIS — I214 Non-ST elevation (NSTEMI) myocardial infarction: Secondary | ICD-10-CM

## 2022-02-03 DIAGNOSIS — Z7902 Long term (current) use of antithrombotics/antiplatelets: Secondary | ICD-10-CM | POA: Insufficient documentation

## 2022-02-03 DIAGNOSIS — Z79899 Other long term (current) drug therapy: Secondary | ICD-10-CM | POA: Insufficient documentation

## 2022-02-03 DIAGNOSIS — R0789 Other chest pain: Secondary | ICD-10-CM | POA: Insufficient documentation

## 2022-02-03 LAB — CBC
HCT: 48.1 % (ref 39.0–52.0)
Hemoglobin: 15.9 g/dL (ref 13.0–17.0)
MCH: 29.1 pg (ref 26.0–34.0)
MCHC: 33.1 g/dL (ref 30.0–36.0)
MCV: 87.9 fL (ref 80.0–100.0)
Platelets: 326 10*3/uL (ref 150–400)
RBC: 5.47 MIL/uL (ref 4.22–5.81)
RDW: 12.8 % (ref 11.5–15.5)
WBC: 6.8 10*3/uL (ref 4.0–10.5)
nRBC: 0 % (ref 0.0–0.2)

## 2022-02-03 LAB — BASIC METABOLIC PANEL
Anion gap: 9 (ref 5–15)
BUN: 14 mg/dL (ref 6–20)
CO2: 25 mmol/L (ref 22–32)
Calcium: 9.9 mg/dL (ref 8.9–10.3)
Chloride: 104 mmol/L (ref 98–111)
Creatinine, Ser: 1.31 mg/dL — ABNORMAL HIGH (ref 0.61–1.24)
GFR, Estimated: >60 mL/min (ref >60)
Glucose, Bld: 100 mg/dL — ABNORMAL HIGH (ref 70–99)
Potassium: 4.1 mmol/L (ref 3.5–5.1)
Sodium: 138 mmol/L (ref 135–145)

## 2022-02-03 LAB — TROPONIN I (HIGH SENSITIVITY)
Troponin I (High Sensitivity): 14 ng/L (ref <18)
Troponin I (High Sensitivity): 15 ng/L (ref <18)

## 2022-02-03 MED ORDER — ACETAMINOPHEN 325 MG PO TABS
650.0000 mg | ORAL_TABLET | Freq: Once | ORAL | Status: AC
  Administered 2022-02-03: 650 mg via ORAL
  Filled 2022-02-03: qty 2

## 2022-02-03 MED ORDER — ROSUVASTATIN CALCIUM 40 MG PO TABS: 40.0000 mg | ORAL_TABLET | Freq: Every day | ORAL | 1 refills | Status: DC

## 2022-02-03 MED ORDER — ASPIRIN 81 MG PO TBEC
81.0000 mg | DELAYED_RELEASE_TABLET | Freq: Every day | ORAL | 1 refills | Status: AC
Start: 2022-02-03 — End: ?

## 2022-02-03 MED ORDER — TETANUS-DIPHTH-ACELL PERTUSSIS 5-2.5-18.5 LF-MCG/0.5 IM SUSY
0.5000 mL | PREFILLED_SYRINGE | Freq: Once | INTRAMUSCULAR | Status: AC
  Administered 2022-02-03: 0.5 mL via INTRAMUSCULAR
  Filled 2022-02-03: qty 0.5

## 2022-02-03 MED ORDER — CLOPIDOGREL BISULFATE 75 MG PO TABS: 75.0000 mg | ORAL_TABLET | Freq: Every day | ORAL | 1 refills | Status: DC

## 2022-02-03 MED ORDER — AMLODIPINE BESYLATE 2.5 MG PO TABS: 2.5000 mg | ORAL_TABLET | Freq: Every day | ORAL | 1 refills | Status: DC

## 2022-02-03 MED ORDER — METOPROLOL SUCCINATE ER 25 MG PO TB24: 25.0000 mg | ORAL_TABLET | Freq: Every day | ORAL | 1 refills | Status: DC

## 2022-02-03 MED ORDER — AMOXICILLIN-POT CLAVULANATE 875-125 MG PO TABS
1.0000 | ORAL_TABLET | Freq: Once | ORAL | Status: AC
Start: 2022-02-03 — End: 2022-02-03
  Administered 2022-02-03: 1 via ORAL
  Filled 2022-02-03: qty 1

## 2022-02-03 MED ORDER — AMOXICILLIN-POT CLAVULANATE 875-125 MG PO TABS: 1.0000 | ORAL_TABLET | Freq: Two times a day (BID) | ORAL | 0 refills | Status: AC

## 2022-02-03 MED ORDER — NITROGLYCERIN 0.4 MG SL SUBL: 0.4000 mg | SUBLINGUAL_TABLET | SUBLINGUAL | 2 refills | Status: AC | PRN

## 2022-02-03 NOTE — ED Provider Triage Note (Signed)
Emergency Medicine Provider Triage Evaluation Note ? ?Paul Chandler , a 31 y.o. male  was evaluated in triage.  Pt complains of chest pain for 3 days. States it feels like a heaviness that is constant. Reports previous heart attack with stent placement. Also complaining of R hand swelling, no injury. Has not been taking prescribed medications.  ? ?Review of Systems  ?Positive: Chest pain, hand pain ?Negative: SOB, syncope, numbness ? ?Physical Exam  ?BP (!) 154/115   Pulse 89   Temp 99.6 ?F (37.6 ?C)   Resp 18   SpO2 98%  ?Gen:   Awake, no distress   ?Resp:  Normal effort  ?MSK:   Moves extremities without difficulty  ?Other:   ? ?Medical Decision Making  ?Medically screening exam initiated at 6:20 PM.  Appropriate orders placed.  Paul Chandler was informed that the remainder of the evaluation will be completed by another provider, this initial triage assessment does not replace that evaluation, and the importance of remaining in the ED until their evaluation is complete. ? ? ?  ?Su Monks, PA-C ?02/03/22 1821 ? ?

## 2022-02-03 NOTE — ED Notes (Signed)
Pt provided discharge instructions and prescription information. Pt was given the opportunity to ask questions and questions were answered. Discharge signature not obtained in the setting of the COVID-19 pandemic in order to reduce high touch surfaces.  ° °

## 2022-02-03 NOTE — ED Provider Notes (Signed)
?[facility] ?Provider Note ? ? ?CSN: [id number] ?Arrival date & time: 02/03/22  1601 ? ?  ? ?History ? ?Chief Complaint  ?Patient presents with  ? Hand Pain  ? Chest Pain  ? ? ?Paul BurrowRonald C Chandler is a 31 y.o. male. ? ?Patient with right hand pain for the last several days.  Has been moving a lot of furniture.  States that he scratched by dog several days ago as well.  Has also had some left-sided chest pain.  2 brief episodes 1 yesterday 1 day before.  Did not feel like his prior heart attacks.  Has not had any chest pain today.  Has not had exertional pain or diaphoresis.  Felt like may be spasm.  Denies any shortness of breath, abdominal pain, fever, chills. ? ? ?Hand Pain ?This is a new problem. The current episode started yesterday. The problem occurs constantly. The problem has not changed since onset.Associated symptoms include chest pain. Nothing aggravates the symptoms. Nothing relieves the symptoms. He has tried nothing for the symptoms. The treatment provided no relief.  ?Chest Pain ? ?  ? ?Home Medications ?Prior to Admission medications   ?Medication Sig Start Date End Date Taking? Authorizing Provider  ?amoxicillin-clavulanate (AUGMENTIN) 875-125 MG tablet Take 1 tablet by mouth every 12 (twelve) hours for 10 days. 02/03/22 02/13/22 Yes Tinsley Everman, DO  ?nitroGLYCERIN (NITROSTAT) 0.4 MG SL tablet Place 1 tablet (0.4 mg total) under the tongue every 5 (five) minutes as needed for chest pain (up to 3 doses). 09/08/21  Yes Lamptey, Britta MccreedyPhilip O, MD  ?amLODipine (NORVASC) 2.5 MG tablet Take 1 tablet (2.5 mg total) by mouth daily. ?Patient not taking: Reported on 02/03/2022 09/08/21   Merrilee JanskyLamptey, Philip O, MD  ?aspirin 81 MG EC tablet TAKE 1 TABLET (81 MG TOTAL) BY MOUTH DAILY. ?Patient not taking: Reported on 02/03/2022 09/08/21   Merrilee JanskyLamptey, Philip O, MD  ?clopidogrel (PLAVIX) 75 MG tablet Take 1 tablet (75 mg total) by mouth daily. ?Patient not taking: Reported on 02/03/2022 09/08/21    Merrilee JanskyLamptey, Philip O, MD  ?metoprolol succinate (TOPROL-XL) 25 MG 24 hr tablet Take 1 tablet (25 mg total) by mouth daily. Take with or immediately following a meal. ?Patient not taking: Reported on 02/03/2022 09/08/21   Merrilee JanskyLamptey, Philip O, MD  ?rosuvastatin (CRESTOR) 40 MG tablet Take 1 tablet (40 mg total) by mouth daily. ?Patient not taking: Reported on 02/03/2022 09/08/21   Merrilee JanskyLamptey, Philip O, MD  ?   ? ?Allergies    ?Patient has no known allergies.   ? ?Review of Systems   ?Review of Systems  ?Cardiovascular:  Positive for chest pain.  ? ?Physical Exam ?Updated Vital Signs ?BP (!) 152/101   Pulse 92   Temp 99.6 ?F (37.6 ?C)   Resp 19   SpO2 99%  ?Physical Exam ?Vitals and nursing note reviewed.  ?Constitutional:   ?   General: He is not in acute distress. ?   Appearance: He is well-developed. He is not ill-appearing.  ?HENT:  ?   Head: Normocephalic and atraumatic.  ?Eyes:  ?   Extraocular Movements: Extraocular movements intact.  ?   Conjunctiva/sclera: Conjunctivae normal.  ?   Pupils: Pupils are equal, round, and reactive to light.  ?Cardiovascular:  ?   Rate and Rhythm: Normal rate and regular rhythm.  ?   Heart sounds: Normal heart sounds. No murmur heard. ?Pulmonary:  ?   Effort: Pulmonary effort is normal. No respiratory distress.  ?  Breath sounds: Normal breath sounds.  ?Abdominal:  ?   Palpations: Abdomen is soft.  ?   Tenderness: There is no abdominal tenderness.  ?Musculoskeletal:     ?   General: No swelling. Normal range of motion.  ?   Cervical back: Normal range of motion and neck supple.  ?   Right lower leg: No edema.  ?   Left lower leg: No edema.  ?   Comments: Some mild swelling to the palm of his right hand just below the start of the pinky finger but patient able to flex and extend his fingers without much issues  ?Skin: ?   General: Skin is warm and dry.  ?   Capillary Refill: Capillary refill takes less than 2 seconds.  ?Neurological:  ?   Mental Status: He is alert.  ?Psychiatric:      ?   Mood and Affect: Mood normal.  ? ? ?ED Results / Procedures / Treatments   ?Labs ?(all labs ordered are listed, but only abnormal results are displayed) ?Labs Reviewed  ?BASIC METABOLIC PANEL - Abnormal; Notable for the following components:  ?    Result Value  ? Glucose, Bld 100 (*)   ? Creatinine, Ser 1.31 (*)   ? All other components within normal limits  ?CBC  ?TROPONIN I (HIGH SENSITIVITY)  ?TROPONIN I (HIGH SENSITIVITY)  ? ? ?EKG ?EKG Interpretation ? ?Date/Time:  Friday February 03 2022 19:23:23 EDT ?Ventricular Rate:  71 ?PR Interval:  171 ?QRS Duration: 95 ?QT Interval:  363 ?QTC Calculation: 395 ?R Axis:   7 ?Text Interpretation: Sinus rhythm RSR' in V1 or V2, probably normal variant ST elev, probable normal early repol pattern Confirmed by Virgina Norfolk [id number]) on 02/03/2022 7:35:33 PM ? ?Radiology ?DG Chest 2 View ? ?Result Date: 02/03/2022 ?CLINICAL DATA:  Chest pain EXAM: CHEST - 2 VIEW COMPARISON:  01/18/2021 FINDINGS: The heart size and mediastinal contours are within normal limits. Both lungs are clear. The visualized skeletal structures are unremarkable. IMPRESSION: No active cardiopulmonary disease. Electronically Signed   By: Helyn Numbers M.D.   On: 02/03/2022 19:42  ? ?DG Hand Complete Right ? ?Result Date: 02/03/2022 ?CLINICAL DATA:  Dog bite, right hand edema EXAM: RIGHT HAND - COMPLETE 3+ VIEW COMPARISON:  None. FINDINGS: There is no evidence of fracture or dislocation. There is no evidence of arthropathy or other focal bone abnormality. There is soft tissue swelling noted of the thenar eminence. No retained radiopaque foreign body. IMPRESSION: Soft tissue swelling.  No acute fracture or dislocation. Electronically Signed   By: Helyn Numbers M.D.   On: 02/03/2022 19:43   ? ?Procedures ?Procedures  ? ? ?Medications Ordered in ED ?Medications  ?Tdap (BOOSTRIX) injection 0.5 mL (0.5 mLs Intramuscular Given 02/03/22 2151)  ?amoxicillin-clavulanate (AUGMENTIN) 875-125 MG per tablet 1 tablet (1  tablet Oral Given 02/03/22 2151)  ?acetaminophen (TYLENOL) tablet 650 mg (650 mg Oral Given 02/03/22 2151)  ? ? ?ED Course/ Medical Decision Making/ A&P ?  ?                        ?Medical Decision Making ?Amount and/or Complexity of Data Reviewed ?Labs: ordered. ?Radiology: ordered. ? ?Risk ?OTC drugs. ?Prescription drug management. ? ? ?Paul Burrow is here for right hand pain, chest pain.  Normal vitals.  No fever.  History of CAD.  Mostly concerned about right hand pain.  States that he has been doing a lot of manual labor  and moving recently.  He was scratched maybe bit superficially in by a dog several days ago.  Tetanus shot updated.  Dog shots up-to-date.  Patient washed the wound out.  Overall appears to maybe have a mild cellulitis of the palm of his right hand.  No concern for deep space infection.  X-ray showed no fracture.  Neurovascular neuromuscular intact.  We will start him on Augmentin. ? ?Patient has also had some chest discomfort briefly once yesterday once today before.  These were nonexertional, no diaphoresis, did not feel like his prior cardiac events.  EKG per my review and interpretation shows sinus rhythm.  No ischemic changes.  Initial troponin is normal.  Talked with cardiology on the phone Dr. Wyline Mood who recommends getting second troponin.  Patient had catheterization last year that showed patent stent.  Nonconcerning EKG and story.  If repeat troponin is normal can follow-up outpatient with cardiology.  Overall no significant anemia, electrolyte abnormality, kidney injury, leukocytosis otherwise.  No concern for PE, pneumonia, pneumothorax.  Chest x-ray per my review and interpretation shows no evidence of pneumonia.  Awaiting delta troponin. ? ?Repeat troponin is normal.  Understands return precautions.  Discharged with antibiotics for dog bite.  Understands return precautions. ? ?This chart was dictated using voice recognition software.  Despite best efforts to proofread,  errors  can occur which can change the documentation meaning.  ? ? ? ? ? ? ? ?Final Clinical Impression(s) / ED Diagnoses ?Final diagnoses:  ?Dog bite, initial encounter  ?Atypical chest pain  ? ? ?Rx / DC Orders ?ED

## 2022-02-03 NOTE — ED Triage Notes (Signed)
Pt here with chest pain X3 days, endorses previous heart attack with stent placement. Pt has not been taking his medications. Describes the pain in his chest as a heaviness. Also complains of swelling to R hand, no known injujryl.  ?

## 2022-02-06 ENCOUNTER — Encounter: Payer: Self-pay | Admitting: Cardiovascular Disease

## 2022-02-06 NOTE — Telephone Encounter (Signed)
error 

## 2022-03-15 ENCOUNTER — Emergency Department (HOSPITAL_COMMUNITY)
Admission: EM | Admit: 2022-03-15 | Discharge: 2022-03-15 | Disposition: A | Discharge status: Home / Self Care | Payer: Medicaid Other | Attending: Emergency Medicine | Admitting: Emergency Medicine

## 2022-03-15 ENCOUNTER — Other Ambulatory Visit: Payer: Self-pay

## 2022-03-15 DIAGNOSIS — Z7982 Long term (current) use of aspirin: Secondary | ICD-10-CM | POA: Insufficient documentation

## 2022-03-15 DIAGNOSIS — Z7902 Long term (current) use of antithrombotics/antiplatelets: Secondary | ICD-10-CM | POA: Insufficient documentation

## 2022-03-15 DIAGNOSIS — Z20828 Contact with and (suspected) exposure to other viral communicable diseases: Secondary | ICD-10-CM | POA: Insufficient documentation

## 2022-03-15 NOTE — Discharge Instructions (Signed)
With out a sore for me to swab we can't test you for herpes from the ER.  You can ask your primary care doctor if they do any other testing.    If you develop any sores please go back to the health department.
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
## 2022-03-15 NOTE — ED Notes (Signed)
Patient verbalizes understanding of discharge instructions. Opportunity for questioning and answers were provided. Arm band removed by staff, patient discharged from ED. 

## 2022-03-15 NOTE — ED Triage Notes (Signed)
See provider note. Patient was seen at health department on Friday. Was exposed to herpes and was wanting to be checked for herpes.

## 2023-05-11 DIAGNOSIS — F1210 Cannabis abuse, uncomplicated: Secondary | ICD-10-CM | POA: Diagnosis not present

## 2023-10-02 ENCOUNTER — Encounter (HOSPITAL_COMMUNITY): Payer: Self-pay

## 2023-10-02 ENCOUNTER — Ambulatory Visit (HOSPITAL_COMMUNITY)
Admission: EM | Admit: 2023-10-02 | Discharge: 2023-10-02 | Disposition: A | Discharge status: Home / Self Care | Payer: Medicaid Other | Attending: Family Medicine | Admitting: Family Medicine

## 2023-10-02 DIAGNOSIS — I10 Essential (primary) hypertension: Secondary | ICD-10-CM | POA: Insufficient documentation

## 2023-10-02 DIAGNOSIS — Z113 Encounter for screening for infections with a predominantly sexual mode of transmission: Secondary | ICD-10-CM | POA: Diagnosis not present

## 2023-10-02 DIAGNOSIS — H10023 Other mucopurulent conjunctivitis, bilateral: Secondary | ICD-10-CM | POA: Insufficient documentation

## 2023-10-02 DIAGNOSIS — I214 Non-ST elevation (NSTEMI) myocardial infarction: Secondary | ICD-10-CM | POA: Diagnosis not present

## 2023-10-02 DIAGNOSIS — I252 Old myocardial infarction: Secondary | ICD-10-CM | POA: Insufficient documentation

## 2023-10-02 LAB — HIV ANTIBODY (ROUTINE TESTING W REFLEX): HIV Screen 4th Generation wRfx: NONREACTIVE

## 2023-10-02 LAB — RPR: RPR Ser Ql: NONREACTIVE

## 2023-10-02 MED ORDER — METOPROLOL SUCCINATE ER 25 MG PO TB24: 25.0000 mg | ORAL_TABLET | Freq: Every day | ORAL | 0 refills | Status: AC

## 2023-10-02 MED ORDER — ROSUVASTATIN CALCIUM 40 MG PO TABS: 40.0000 mg | ORAL_TABLET | Freq: Every day | ORAL | 0 refills | Status: AC

## 2023-10-02 MED ORDER — AMLODIPINE BESYLATE 2.5 MG PO TABS: 2.5000 mg | ORAL_TABLET | Freq: Every day | ORAL | 0 refills | Status: AC

## 2023-10-02 MED ORDER — CLOPIDOGREL BISULFATE 75 MG PO TABS: 75.0000 mg | ORAL_TABLET | Freq: Every day | ORAL | 0 refills | Status: AC

## 2023-10-02 MED ORDER — MOXIFLOXACIN HCL 0.5 % OP SOLN: 1.0000 [drp] | Freq: Three times a day (TID) | OPHTHALMIC | 0 refills | Status: AC
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
## 2023-10-02 NOTE — ED Triage Notes (Signed)
Pt c/o bilateral eye redness, drainage, and irritation since yesterday. States his son had pink eye last week.  Pt requesting routine STD check with blood work. Denise sx's.

## 2023-10-02 NOTE — Discharge Instructions (Signed)
You were seen today for various issues.  I have sent out an antibiotic eye drop for pink eye.  Avoid touching your eyes and wash your hands often.  Your blood work and swab will be resulted tomorrow.  You will be notified if there is any need for treatment.  I have refilled your medications.  You need to follow up with cardiology, and re-establish care with a primary care provider.  You may go to [email or web address] to find one that works for you.

## 2023-10-02 NOTE — ED Triage Notes (Signed)
Pt woke up with bilateral pink eye this morning.  Pt reports STD testing. Denies any symptoms.  Pt would like a refill on his blood pressure medication.

## 2023-10-03 LAB — CYTOLOGY, (ORAL, ANAL, URETHRAL) ANCILLARY ONLY
Chlamydia: NEGATIVE
Comment: NEGATIVE
Comment: NEGATIVE
Comment: NORMAL
Neisseria Gonorrhea: NEGATIVE
Trichomonas: POSITIVE — AB

## 2023-10-05 ENCOUNTER — Telehealth (HOSPITAL_COMMUNITY): Payer: Self-pay | Admitting: Emergency Medicine

## 2023-10-05 MED ORDER — METRONIDAZOLE 500 MG PO TABS: 2000.0000 mg | ORAL_TABLET | Freq: Once | ORAL | 0 refills | Status: AC

## 2023-10-05 NOTE — Telephone Encounter (Signed)
Pt called in regards to positive test result. Informed RX would be sent to [facility].

## 2023-11-26 ENCOUNTER — Emergency Department (HOSPITAL_COMMUNITY): Payer: Medicaid Other

## 2023-11-26 ENCOUNTER — Emergency Department (HOSPITAL_COMMUNITY)
Admission: EM | Admit: 2023-11-26 | Discharge: 2023-11-26 | Disposition: A | Discharge status: Home / Self Care | Payer: Medicaid Other | Attending: Emergency Medicine | Admitting: Emergency Medicine

## 2023-11-26 ENCOUNTER — Other Ambulatory Visit: Payer: Self-pay

## 2023-11-26 DIAGNOSIS — Y92410 Unspecified street and highway as the place of occurrence of the external cause: Secondary | ICD-10-CM | POA: Insufficient documentation

## 2023-11-26 DIAGNOSIS — I2510 Atherosclerotic heart disease of native coronary artery without angina pectoris: Secondary | ICD-10-CM | POA: Insufficient documentation

## 2023-11-26 DIAGNOSIS — M25531 Pain in right wrist: Secondary | ICD-10-CM | POA: Diagnosis not present

## 2023-11-26 DIAGNOSIS — S61411A Laceration without foreign body of right hand, initial encounter: Secondary | ICD-10-CM | POA: Diagnosis not present

## 2023-11-26 DIAGNOSIS — S59912A Unspecified injury of left forearm, initial encounter: Secondary | ICD-10-CM | POA: Diagnosis not present

## 2023-11-26 DIAGNOSIS — S50812A Abrasion of left forearm, initial encounter: Secondary | ICD-10-CM | POA: Diagnosis not present

## 2023-11-26 DIAGNOSIS — M79632 Pain in left forearm: Secondary | ICD-10-CM | POA: Diagnosis not present

## 2023-11-26 DIAGNOSIS — Z23 Encounter for immunization: Secondary | ICD-10-CM | POA: Insufficient documentation

## 2023-11-26 DIAGNOSIS — S6991XA Unspecified injury of right wrist, hand and finger(s), initial encounter: Secondary | ICD-10-CM | POA: Diagnosis not present

## 2023-11-26 DIAGNOSIS — S59911A Unspecified injury of right forearm, initial encounter: Secondary | ICD-10-CM | POA: Diagnosis not present

## 2023-11-26 DIAGNOSIS — S60211A Contusion of right wrist, initial encounter: Secondary | ICD-10-CM | POA: Diagnosis not present

## 2023-11-26 DIAGNOSIS — M79631 Pain in right forearm: Secondary | ICD-10-CM | POA: Diagnosis not present

## 2023-11-26 DIAGNOSIS — S50811A Abrasion of right forearm, initial encounter: Secondary | ICD-10-CM | POA: Diagnosis not present

## 2023-11-26 DIAGNOSIS — S61511A Laceration without foreign body of right wrist, initial encounter: Secondary | ICD-10-CM | POA: Diagnosis not present

## 2023-11-26 MED ORDER — IBUPROFEN 400 MG PO TABS
600.0000 mg | ORAL_TABLET | Freq: Once | ORAL | Status: AC
  Administered 2023-11-26: 600 mg via ORAL
  Filled 2023-11-26: qty 1

## 2023-11-26 MED ORDER — BACITRACIN ZINC 500 UNIT/GM EX OINT
TOPICAL_OINTMENT | Freq: Once | CUTANEOUS | Status: AC
  Administered 2023-11-26: 1 via TOPICAL
  Filled 2023-11-26: qty 0.9

## 2023-11-26 MED ORDER — TETANUS-DIPHTH-ACELL PERTUSSIS 5-2.5-18.5 LF-MCG/0.5 IM SUSY
0.5000 mL | PREFILLED_SYRINGE | Freq: Once | INTRAMUSCULAR | Status: AC
  Administered 2023-11-26: 0.5 mL via INTRAMUSCULAR
  Filled 2023-11-26: qty 0.5

## 2023-11-26 MED ORDER — METHOCARBAMOL 500 MG PO TABS
500.0000 mg | ORAL_TABLET | Freq: Once | ORAL | Status: AC
  Administered 2023-11-26: 500 mg via ORAL
  Filled 2023-11-26: qty 1

## 2023-11-26 MED ORDER — ACETAMINOPHEN 500 MG PO TABS
1000.0000 mg | ORAL_TABLET | Freq: Once | ORAL | Status: AC
  Administered 2023-11-26: 1000 mg via ORAL
  Filled 2023-11-26: qty 2

## 2023-11-26 NOTE — ED Notes (Signed)
 Patient transported to X-ray

## 2023-11-26 NOTE — Discharge Instructions (Addendum)
Your wound will be cleaned and wrapped with nonstick dressing prior to discharge.  You can apply topical antibiotics (bacitracin or Neosporin) twice a day for the next 5 days.  Use Tylenol every 4 hours as needed for pain 1000 mg at a time.  Return for signs of infection such as spreading redness, pus draining or new concerns.

## 2023-11-26 NOTE — ED Notes (Signed)
 Patient transported to CT
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
## 2023-11-26 NOTE — ED Notes (Signed)
Wound care performed. Wounds cleaned and Xeroform gauze used for rtposterior hand wound. Bacitracin placed on other wounds to right hand after cleansing. Pt tolerated well.
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
## 2024-07-09 DIAGNOSIS — Z113 Encounter for screening for infections with a predominantly sexual mode of transmission: Secondary | ICD-10-CM | POA: Diagnosis not present

## 2024-07-09 DIAGNOSIS — Z202 Contact with and (suspected) exposure to infections with a predominantly sexual mode of transmission: Secondary | ICD-10-CM | POA: Diagnosis not present

## 2024-07-09 DIAGNOSIS — Z114 Encounter for screening for human immunodeficiency virus [HIV]: Secondary | ICD-10-CM | POA: Diagnosis not present
